# Patient Record
Sex: Female | Born: 1960
Health system: Southern US, Community
[De-identification: ages and names within clinical notes are randomized; demographics above are authoritative.]

## PROBLEM LIST (undated history)

## (undated) DIAGNOSIS — N879 Dysplasia of cervix uteri, unspecified: Secondary | ICD-10-CM

## (undated) DIAGNOSIS — E079 Disorder of thyroid, unspecified: Secondary | ICD-10-CM

## (undated) DIAGNOSIS — B019 Varicella without complication: Secondary | ICD-10-CM

## (undated) DIAGNOSIS — R011 Cardiac murmur, unspecified: Secondary | ICD-10-CM

## (undated) DIAGNOSIS — C4491 Basal cell carcinoma of skin, unspecified: Secondary | ICD-10-CM

## (undated) DIAGNOSIS — R87619 Unspecified abnormal cytological findings in specimens from cervix uteri: Secondary | ICD-10-CM

## (undated) HISTORY — DX: Disorder of thyroid, unspecified: E07.9

## (undated) HISTORY — DX: Basal cell carcinoma of skin, unspecified: C44.91

## (undated) HISTORY — DX: Cardiac murmur, unspecified: R01.1

## (undated) HISTORY — DX: Dysplasia of cervix uteri, unspecified: N87.9

## (undated) HISTORY — PX: MOHS SURGERY: SUR867

## (undated) HISTORY — DX: Unspecified abnormal cytological findings in specimens from cervix uteri: R87.619

## (undated) HISTORY — PX: AUGMENTATION MAMMAPLASTY: SUR837

## (undated) HISTORY — DX: Varicella without complication: B01.9

---

## 2002-06-07 HISTORY — PX: OTHER SURGICAL HISTORY: SHX169

## 2002-06-07 HISTORY — PX: BREAST SURGERY: SHX581

## 2003-03-23 LAB — HM MAMMOGRAPHY: HM Mammogram: NEGATIVE

## 2005-06-07 DIAGNOSIS — N879 Dysplasia of cervix uteri, unspecified: Secondary | ICD-10-CM

## 2005-06-07 HISTORY — DX: Dysplasia of cervix uteri, unspecified: N87.9

## 2005-06-07 HISTORY — PX: GYNECOLOGIC CRYOSURGERY: SHX857

## 2010-03-22 LAB — HM PAP SMEAR: HM Pap smear: NORMAL

## 2011-01-21 ENCOUNTER — Ambulatory Visit (INDEPENDENT_AMBULATORY_CARE_PROVIDER_SITE_OTHER): Payer: Managed Care, Other (non HMO) | Admitting: Family Medicine

## 2011-01-21 ENCOUNTER — Encounter: Payer: Self-pay | Admitting: Family Medicine

## 2011-01-21 VITALS — BP 128/80 | HR 72 | Temp 98.1°F | Resp 12 | Ht 64.5 in | Wt 170.0 lb

## 2011-01-21 DIAGNOSIS — F411 Generalized anxiety disorder: Secondary | ICD-10-CM

## 2011-01-21 DIAGNOSIS — G4700 Insomnia, unspecified: Secondary | ICD-10-CM

## 2011-01-21 DIAGNOSIS — Z0000 Encounter for general adult medical examination without abnormal findings: Secondary | ICD-10-CM

## 2011-01-21 DIAGNOSIS — F419 Anxiety disorder, unspecified: Secondary | ICD-10-CM

## 2011-01-21 LAB — LIPID PANEL
Cholesterol: 240 mg/dL — ABNORMAL HIGH (ref 0–200)
Total CHOL/HDL Ratio: 3
Triglycerides: 141 mg/dL (ref 0.0–149.0)

## 2011-01-21 LAB — CBC WITH DIFFERENTIAL/PLATELET
Basophils Absolute: 0 10*3/uL (ref 0.0–0.1)
Eosinophils Absolute: 0.1 10*3/uL (ref 0.0–0.7)
HCT: 41.9 % (ref 36.0–46.0)
Hemoglobin: 14.4 g/dL (ref 12.0–15.0)
Lymphs Abs: 1.8 10*3/uL (ref 0.7–4.0)
MCHC: 34.3 g/dL (ref 30.0–36.0)
Monocytes Absolute: 0.4 10*3/uL (ref 0.1–1.0)
Neutro Abs: 3 10*3/uL (ref 1.4–7.7)
Platelets: 173 10*3/uL (ref 150.0–400.0)
RDW: 12 % (ref 11.5–14.6)

## 2011-01-21 LAB — HEPATIC FUNCTION PANEL
Albumin: 4.3 g/dL (ref 3.5–5.2)
Total Protein: 7.5 g/dL (ref 6.0–8.3)

## 2011-01-21 LAB — LDL CHOLESTEROL, DIRECT: Direct LDL: 158 mg/dL

## 2011-01-21 LAB — BASIC METABOLIC PANEL
BUN: 14 mg/dL (ref 6–23)
Chloride: 102 mEq/L (ref 96–112)
Creatinine, Ser: 0.9 mg/dL (ref 0.4–1.2)
GFR: 69.31 mL/min (ref >60.00)
Potassium: 4.3 mEq/L (ref 3.5–5.1)

## 2011-01-21 LAB — TSH: TSH: 3.95 u[IU]/mL (ref 0.35–5.50)

## 2011-01-21 MED ORDER — LORAZEPAM 0.5 MG PO TABS: 0.5000 mg | ORAL_TABLET | Freq: Three times a day (TID) | ORAL | Status: AC

## 2011-01-21 MED ORDER — ZOLPIDEM TARTRATE 10 MG PO TABS: 10.0000 mg | ORAL_TABLET | Freq: Every evening | ORAL | Status: DC | PRN

## 2011-01-21 NOTE — Progress Notes (Signed)
  Subjective:    Patient ID: Colleen Martinez, female    DOB: 08-01-1960, 50 y.o.   MRN: [id number]  HPI New patient to establish care. 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 Breast augmentation surgery 2004. Sees gynecologist regularly. Maintain on birth control. No known drug allergies. No chronic medical problems.  Increased anxiety mostly related to work. Previously has been on low-dose benzodiazepine which she uses very rarely. Requesting refills. Also has transient problems with sleep and has taken generic Ambien in the past rarely and requesting refill.  Family history is significant for mother with ovarian cancer. Both parents with hyperlipidemia. Both parents osteoarthritis.  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  Nonsmoker. Occasional alcohol use.  Last mammogram 2004. Tetanus 2007.  Patient's concerns of recent weight gain. 35 pound weight gain in past 3-4 years. No consistent exercise but started back recently. Trying to make some dietary changes as well.   Review of Systems  Constitutional: Negative for fever, activity change, appetite change and fatigue.  HENT: Negative for hearing loss, ear pain, sore throat and trouble swallowing.   Eyes: Negative for visual disturbance.  Respiratory: Negative for cough and shortness of breath.   Cardiovascular: Negative for chest pain and palpitations.  Gastrointestinal: Negative for abdominal pain, diarrhea, constipation and blood in stool.  Genitourinary: Negative for dysuria and hematuria.  Musculoskeletal: Negative for myalgias, back pain and arthralgias.  Skin: Negative for rash.  Neurological: Negative for dizziness, syncope and headaches.  Hematological: Negative for adenopathy.  Psychiatric/Behavioral: Negative for confusion and dysphoric mood.       Objective:   Physical Exam  Constitutional: She is oriented to person, place, and time. She appears well-developed and  well-nourished.  HENT:  Head: Normocephalic and atraumatic.  Eyes: EOM are normal. Pupils are equal, round, and reactive to light.  Neck: Normal range of motion. Neck supple. No thyromegaly present.  Cardiovascular: Normal rate, regular rhythm and normal heart sounds.   No murmur heard. Pulmonary/Chest: Breath sounds normal. No respiratory distress. She has no wheezes. She has no rales.  Abdominal: Soft. Bowel sounds are normal. She exhibits no distension and no mass. There is no tenderness. There is no rebound and no guarding.  Genitourinary:       Breast and pelvic per GYN  Musculoskeletal: Normal range of motion. She exhibits no edema.  Lymphadenopathy:    She has no cervical adenopathy.  Neurological: She is alert and oriented to person, place, and time. She displays normal reflexes. No cranial nerve deficit.  Skin: No rash noted.  Psychiatric: She has a normal mood and affect. Her behavior is normal. Judgment and thought content normal.          Assessment & Plan:  Health maintenance. Obtain screening lab work. Discussed diet and weight loss strategies. Patient will continue to see GYN for Pap smears and breast exam. Tetanus up-to-date.  History of chronic anxiety and transient insomnia. Refill lorazepam 0.5 mg every 6 hours to use sparingly and generic Ambien to use prn for sleep issues.  Sleep hygiene discussed.

## 2011-01-22 NOTE — Progress Notes (Signed)
Quick Note:  Pt informed on home VM ______ 

## 2011-03-23 ENCOUNTER — Encounter: Payer: Self-pay | Admitting: Gynecology

## 2011-03-23 ENCOUNTER — Ambulatory Visit (INDEPENDENT_AMBULATORY_CARE_PROVIDER_SITE_OTHER): Payer: Managed Care, Other (non HMO) | Admitting: Gynecology

## 2011-03-23 ENCOUNTER — Other Ambulatory Visit (HOSPITAL_COMMUNITY)
Admission: RE | Admit: 2011-03-23 | Discharge: 2011-03-23 | Disposition: A | Discharge status: Home / Self Care | Payer: Managed Care, Other (non HMO) | Source: Ambulatory Visit | Attending: Gynecology | Admitting: Gynecology

## 2011-03-23 VITALS — BP 120/70 | Ht 65.0 in | Wt 174.0 lb

## 2011-03-23 DIAGNOSIS — Z3009 Encounter for other general counseling and advice on contraception: Secondary | ICD-10-CM

## 2011-03-23 DIAGNOSIS — B3731 Acute candidiasis of vulva and vagina: Secondary | ICD-10-CM

## 2011-03-23 DIAGNOSIS — N898 Other specified noninflammatory disorders of vagina: Secondary | ICD-10-CM

## 2011-03-23 DIAGNOSIS — B373 Candidiasis of vulva and vagina: Secondary | ICD-10-CM

## 2011-03-23 DIAGNOSIS — Z01419 Encounter for gynecological examination (general) (routine) without abnormal findings: Secondary | ICD-10-CM

## 2011-03-23 DIAGNOSIS — R823 Hemoglobinuria: Secondary | ICD-10-CM

## 2011-03-23 DIAGNOSIS — Z0000 Encounter for general adult medical examination without abnormal findings: Secondary | ICD-10-CM

## 2011-03-23 MED ORDER — NORETHIN ACE-ETH ESTRAD-FE 1.5-30 MG-MCG PO TABS: 1.0000 | ORAL_TABLET | Freq: Every day | ORAL | Status: DC

## 2011-03-23 MED ORDER — FLUCONAZOLE 150 MG PO TABS: 150.0000 mg | ORAL_TABLET | Freq: Once | ORAL | Status: AC

## 2011-03-23 NOTE — Progress Notes (Signed)
Colleen Martinez 01-24-1961 [id number]        50 y.o.  for annual exam.  Former patient of Dr. Leota Sauers doing well. She does note some difficulty losing weight although admits to not exercising. She recently saw Dr. Caryl Never her primary who did routine blood work to include a thyroid panel which was normal. She is on oral contraceptives and doing well with these. She does have a history of low-grade SIL Pap smear in 2007 with subsequent cryosurgery and has had normal Pap smears since then.  Past medical history,surgical history, medications, allergies, family history and social history were all reviewed and documented in the EPIC chart. ROS:  Was performed and pertinent positives and negatives are included in the history.  Exam: chaperone present Filed Vitals:   03/23/11 0850  BP: 120/70   General appearance  Normal Skin grossly normal Head/Neck normal with no cervical or supraclavicular adenopathy thyroid normal Lungs  clear Cardiac RR, 1/6 systolic ejection murmur Abdominal  soft, nontender, without masses, organomegaly or hernia Breasts  examined lying and sitting without masses, retractions, discharge or axillary adenopathy.  Bilateral implants noted. Pelvic  Ext/BUS/vagina  normal with white discharge KOH wet prep done  Cervix  normal  Pap done  Uterus  anteverted, normal size, shape and contour, midline and mobile nontender   Adnexa  Without masses or tenderness    Anus and perineum  normal   Rectovaginal  normal sphincter tone without palpated masses or tenderness.    Assessment/Plan:  50 y.o. female for annual exam.    1. White discharge. KOH wet prep is positive for yeast we'll treat with Diflucan 150x1 dose follow up if symptoms develop. 2. Low-grade SIL Pap smear 2007 status post cryosurgery. Pap smears since then have been normal. I discussed current screening recommendations for less frequent screening. I did a Pap smear today if normal I discussed going to an every  3 year screen and she agrees with this. 3. Oral contraceptives. I reviewed the risk of oral contraceptives to include increased risk of stroke heart attack DVT. She is not being followed for medical issues does not smoke accepts the risks and wants to continue it I refilled her x1 year. 4. Heart murmur. Patient has had this evaluated in the past and was felt to be a functional murmur no further evaluation needed 5. Health maintenance. Self breast exams on a monthly basis discussed encouraged. She does have bilateral implants.  She's not had a mammogram in several years I recommended she go ahead and schedule this and she agrees to do so. No blood work was done today this is all done through Dr. Lucie Leather office.  Assuming she continues well then she will see [place] in a year sooner as needed.    Dara Lords MD, 9:58 AM 03/23/2011

## 2011-03-25 ENCOUNTER — Encounter: Payer: Self-pay | Admitting: Gynecology

## 2011-08-02 ENCOUNTER — Telehealth: Payer: Self-pay | Admitting: *Deleted

## 2011-08-02 MED ORDER — FLUCONAZOLE 150 MG PO TABS: 150.0000 mg | ORAL_TABLET | Freq: Once | ORAL | Status: AC

## 2011-08-02 NOTE — Telephone Encounter (Signed)
Pt called c/o yeast infection x 2 days now. Itching, very light discharge, and vaginal irritation. Pt called requesting diflucan which was given at annual on 03/23/11. Please advise

## 2011-08-02 NOTE — Telephone Encounter (Signed)
Pt informed with the below note. 

## 2011-08-02 NOTE — Telephone Encounter (Signed)
Diflucan 150 mg x1 and okay.

## 2011-08-23 ENCOUNTER — Ambulatory Visit (INDEPENDENT_AMBULATORY_CARE_PROVIDER_SITE_OTHER): Payer: Managed Care, Other (non HMO) | Admitting: Family Medicine

## 2011-08-23 ENCOUNTER — Encounter: Payer: Self-pay | Admitting: Family Medicine

## 2011-08-23 VITALS — BP 142/80 | Temp 98.1°F | Wt 178.0 lb

## 2011-08-23 DIAGNOSIS — J209 Acute bronchitis, unspecified: Secondary | ICD-10-CM

## 2011-08-23 MED ORDER — HYDROCOD POLST-CHLORPHEN POLST 10-8 MG/5ML PO LQCR: 5.0000 mL | Freq: Two times a day (BID) | ORAL | Status: DC | PRN

## 2011-08-23 NOTE — Patient Instructions (Signed)

## 2011-08-23 NOTE — Progress Notes (Signed)
  Subjective:    Patient ID: Colleen Martinez, female    DOB: 1960-08-17, 51 y.o.   MRN: [id number]  HPI  Acute visit.  3-4 day history of cough which is mostly nonproductive. Initially started with sore throat which is slightly better. She has postnasal drainage and some nausea related to that. No vomiting. Poor sleep secondary to cough. Increased fatigue. No fever. Several coworkers have had somewhat similar illness. Patient is nonsmoker   Review of Systems  Constitutional: Positive for fatigue. Negative for fever and chills.  HENT: Positive for congestion and postnasal drip. Negative for ear pain, sore throat and voice change.   Respiratory: Positive for cough. Negative for shortness of breath and wheezing.   Cardiovascular: Negative for chest pain.  Neurological: Negative for headaches.       Objective:   Physical Exam  Constitutional: She appears well-developed and well-nourished. No distress.  HENT:  Right Ear: External ear normal.  Left Ear: External ear normal.  Mouth/Throat: Oropharynx is clear and moist.  Neck: Neck supple. No thyromegaly present.  Cardiovascular: Normal rate and regular rhythm.   Pulmonary/Chest: Effort normal and breath sounds normal. No respiratory distress. She has no wheezes. She has no rales.  Lymphadenopathy:    She has no cervical adenopathy.          Assessment & Plan:  Cough. Suspect acute viral bronchitis. Tussionex for nighttime cough as needed. No indication for antibiotic at this time

## 2011-09-06 ENCOUNTER — Telehealth: Payer: Self-pay | Admitting: *Deleted

## 2011-09-06 NOTE — Telephone Encounter (Signed)
Be happy to see today,,,,,,,,,, cannot: Antibiotics over the telephone

## 2011-09-06 NOTE — Telephone Encounter (Signed)
Pt informed on personally identified VM 

## 2011-09-06 NOTE — Telephone Encounter (Signed)
Dr Caryl Never out of the office until Wed.  Please advise

## 2011-09-06 NOTE — Telephone Encounter (Signed)
Pt states she is not better with her cough and sinus congestion, and Dr. Caryl Never advised her to call him back if not, and he will call in a Zpack.

## 2011-09-23 ENCOUNTER — Telehealth: Payer: Self-pay | Admitting: *Deleted

## 2011-09-23 NOTE — Telephone Encounter (Signed)
Start with plain Allegra at 180 mg once daily

## 2011-09-23 NOTE — Telephone Encounter (Signed)
Pt has gotten over the bronchitis, but now has swollen sinus passages from seasonal allergies,and would like to know what Dr. Caryl Never would recommend OTC to take for this one symptom?

## 2011-09-23 NOTE — Telephone Encounter (Signed)
Pt. Notified.

## 2012-01-05 ENCOUNTER — Ambulatory Visit (INDEPENDENT_AMBULATORY_CARE_PROVIDER_SITE_OTHER): Payer: Managed Care, Other (non HMO) | Admitting: Family Medicine

## 2012-01-05 ENCOUNTER — Encounter: Payer: Self-pay | Admitting: Family Medicine

## 2012-01-05 VITALS — BP 138/86 | Temp 98.0°F | Wt 176.0 lb

## 2012-01-05 DIAGNOSIS — R5383 Other fatigue: Secondary | ICD-10-CM

## 2012-01-05 DIAGNOSIS — E785 Hyperlipidemia, unspecified: Secondary | ICD-10-CM

## 2012-01-05 DIAGNOSIS — M545 Low back pain, unspecified: Secondary | ICD-10-CM

## 2012-01-05 DIAGNOSIS — R5381 Other malaise: Secondary | ICD-10-CM

## 2012-01-05 DIAGNOSIS — IMO0001 Reserved for inherently not codable concepts without codable children: Secondary | ICD-10-CM

## 2012-01-05 DIAGNOSIS — R030 Elevated blood-pressure reading, without diagnosis of hypertension: Secondary | ICD-10-CM

## 2012-01-05 LAB — BASIC METABOLIC PANEL
CO2: 24 mEq/L (ref 19–32)
Calcium: 9 mg/dL (ref 8.4–10.5)
Chloride: 104 mEq/L (ref 96–112)
Glucose, Bld: 90 mg/dL (ref 70–99)
Sodium: 138 mEq/L (ref 135–145)

## 2012-01-05 LAB — CBC WITH DIFFERENTIAL/PLATELET
Basophils Absolute: 0 10*3/uL (ref 0.0–0.1)
Eosinophils Relative: 0.9 % (ref 0.0–5.0)
HCT: 40 % (ref 36.0–46.0)
Hemoglobin: 13.7 g/dL (ref 12.0–15.0)
Lymphocytes Relative: 28.3 % (ref 12.0–46.0)
Lymphs Abs: 1.6 10*3/uL (ref 0.7–4.0)
Monocytes Relative: 6.1 % (ref 3.0–12.0)
Neutro Abs: 3.6 10*3/uL (ref 1.4–7.7)
Platelets: 176 10*3/uL (ref 150.0–400.0)
RDW: 12.1 % (ref 11.5–14.6)
WBC: 5.6 10*3/uL (ref 4.5–10.5)

## 2012-01-05 LAB — LIPID PANEL: Total CHOL/HDL Ratio: 4

## 2012-01-05 LAB — TSH: TSH: 3.57 u[IU]/mL (ref 0.35–5.50)

## 2012-01-05 MED ORDER — CLORAZEPATE DIPOTASSIUM 3.75 MG PO TABS: 3.7500 mg | ORAL_TABLET | Freq: Two times a day (BID) | ORAL | Status: AC | PRN

## 2012-01-05 NOTE — Progress Notes (Signed)
  Subjective:    Patient ID: Colleen Martinez, female    DOB: [birth year], 51 y.o.   MRN: [id number]  HPI  Patient seen with multiple issues today. She has progressive fatigue over several weeks. Generally sleeping well. Increased job stress. She states she's had some mild weight gain recently but looking over records she's actually lost 2 pounds since last visit here several months ago. She has seen a nutritionist and trainer and does exercise fairly regularly but still feels fatigue and has not been able to lose weight. Check thyroid functions about a year ago which were normal. She's had slightly increasing blood pressure recently with systolic 140-150 . No headaches.  Low back pain for one and one half years off and on. Sits mostly at work. Location is mid lumbar back. Occasional radiation toward thighs bilaterally. She's tried nonsteroidals, hydrocodone, muscle relaxers, and gabapentin without relief. Was placed on prednisone once which helped slightly and temporarily. No numbness or weakness. She's never tried physical therapy. No history of x-rays. No prior history of cancer. No appetite or weight changes. Pain is worse supine.  Past Medical History  Diagnosis Date  . Chicken pox   . Heart murmur     functional   Past Surgical History  Procedure Date  . Breast surgery 2004    breast augmentation  . Liposuction 2004    stomach , bi-lat thighs  . Gynecologic cryosurgery 2007    LGSIL normal paps since    reports that she has never smoked. She has never used smokeless tobacco. She reports that she drinks alcohol. She reports that she does not use illicit drugs. family history includes Arthritis in her father and mother; Cancer in her mother; and Hyperlipidemia in her father and mother. No Known Allergies    Review of Systems  Constitutional: Positive for fatigue. Negative for fever, chills, appetite change and unexpected weight change.  Respiratory: Negative for cough and shortness  of breath.   Cardiovascular: Negative for chest pain and leg swelling.  Gastrointestinal: Negative for abdominal pain.  Genitourinary: Negative for dysuria.  Neurological: Negative for dizziness, weakness and headaches.  Hematological: Negative for adenopathy.       Objective:   Physical Exam  Constitutional: She is oriented to person, place, and time. She appears well-developed and well-nourished. No distress.  HENT:  Mouth/Throat: Oropharynx is clear and moist.  Neck: Neck supple. No thyromegaly present.  Cardiovascular: Normal rate and regular rhythm.   Pulmonary/Chest: Effort normal and breath sounds normal. No respiratory distress. She has no wheezes. She has no rales.  Musculoskeletal: She exhibits no edema.  Lymphadenopathy:    She has no cervical adenopathy.  Neurological: She is alert and oriented to person, place, and time. No cranial nerve deficit.          Assessment & Plan:  #1 borderline elevated blood pressure. Work on weight loss and continue aerobic exercise and continue close monitoring #2 increasing fatigue. Uncertain etiology. Check labs including TSH  #3 history of hyperlipidemia. Recheck fasting lipid panel  #4 chronic intermittent low back pain. Nonfocal neurologic exam. Suspect musculoskeletal. Trial physical therapy. If better in 3-4 weeks consider MRI to further assess

## 2012-01-07 NOTE — Progress Notes (Signed)
Quick Note:  Pt informed ______ 

## 2012-03-06 ENCOUNTER — Other Ambulatory Visit: Payer: Self-pay | Admitting: Gynecology

## 2012-03-28 ENCOUNTER — Encounter: Payer: Self-pay | Admitting: Gynecology

## 2012-03-28 ENCOUNTER — Ambulatory Visit (INDEPENDENT_AMBULATORY_CARE_PROVIDER_SITE_OTHER): Payer: Managed Care, Other (non HMO) | Admitting: Gynecology

## 2012-03-28 VITALS — BP 126/86 | Ht 64.75 in | Wt 176.0 lb

## 2012-03-28 DIAGNOSIS — Z309 Encounter for contraceptive management, unspecified: Secondary | ICD-10-CM

## 2012-03-28 DIAGNOSIS — Z01419 Encounter for gynecological examination (general) (routine) without abnormal findings: Secondary | ICD-10-CM

## 2012-03-28 MED ORDER — ALPRAZOLAM 0.25 MG PO TABS: 0.2500 mg | ORAL_TABLET | Freq: Every evening | ORAL | Status: DC | PRN

## 2012-03-28 MED ORDER — NORETHIN ACE-ETH ESTRAD-FE 1.5-30 MG-MCG PO TABS: 1.0000 | ORAL_TABLET | Freq: Every day | ORAL | Status: DC

## 2012-03-28 NOTE — Progress Notes (Signed)
Colleen Martinez [birth year] [id number]        51 y.o.  G3P0030 for annual exam.  Doing well.  Past medical history,surgical history, medications, allergies, family history and social history were all reviewed and documented in the EPIC chart. ROS:  Was performed and pertinent positives and negatives are included in the history.  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  Bilateral implants noted Pelvic  Ext/BUS/vagina  normal   Cervix  normal   Uterus  anteverted, normal size, shape and contour, midline and mobile nontender   Adnexa  Without masses or tenderness    Anus and perineum  normal   Rectovaginal  normal sphincter tone without palpated masses or tenderness.    Assessment/Plan:  51 y.o. G33P0030 female for annual exam.   1. Oral contraceptives. Patient doing well with scant to absent menses. No menopausal symptoms during pill free week. Reviewed risks again with her to include stroke heart attack DVT. Options to continue, switch to a different methods such as IUD or to a lower dose 20 mcg pill all reviewed. Patient was to continue she's doing well and I refilled her times a year.  Will plan pill free week FSH as she gets into the 50s. 2. Anxiety. Patient having some anxiety with mother while living with her. Various options reviewed. Patient wants to try Xanax 0.25 #30 refill x1 when necessary. Side effect profile reviewed. 3. Mammography. Patient has not had a mammogram in a number of years. I strongly recommended she schedule this and the issues of early  detection reviewed. Patient agrees to schedule. SBE monthly reviewed. 4. Pap smear. No Pap smear done today. Last Pap smear 2012. Plan every 3-5 your screening. 5. Heart murmur. 1/6 systolic ejection murmur consistent with functional murmur issues been diagnosed with in the past. 6. Health maintenance. Actively sees Dr. Caryl Never with recent blood work. The blood work done today. Follow up one year, sooner as needed.    Dara Lords MD, 9:26 AM 03/28/2012

## 2012-03-28 NOTE — Patient Instructions (Signed)
Try Xanax as we discussed. Let me know if there are any issues. Follow up in one year for annual gynecologic exam.

## 2012-07-14 ENCOUNTER — Other Ambulatory Visit: Payer: Self-pay | Admitting: *Deleted

## 2012-07-14 DIAGNOSIS — G4700 Insomnia, unspecified: Secondary | ICD-10-CM

## 2012-07-14 MED ORDER — ZOLPIDEM TARTRATE 10 MG PO TABS: 10.0000 mg | ORAL_TABLET | Freq: Every evening | ORAL | Status: DC | PRN

## 2012-07-24 ENCOUNTER — Encounter: Payer: Self-pay | Admitting: Family Medicine

## 2012-07-24 ENCOUNTER — Ambulatory Visit (INDEPENDENT_AMBULATORY_CARE_PROVIDER_SITE_OTHER): Payer: BC Managed Care – PPO | Admitting: Family Medicine

## 2012-07-24 VITALS — BP 140/84 | Temp 98.8°F | Wt 179.0 lb

## 2012-07-24 DIAGNOSIS — J209 Acute bronchitis, unspecified: Secondary | ICD-10-CM

## 2012-07-24 DIAGNOSIS — J208 Acute bronchitis due to other specified organisms: Secondary | ICD-10-CM

## 2012-07-24 MED ORDER — HYDROCOD POLST-CHLORPHEN POLST 10-8 MG/5ML PO LQCR: 5.0000 mL | Freq: Two times a day (BID) | ORAL | Status: DC | PRN

## 2012-07-24 NOTE — Patient Instructions (Addendum)

## 2012-07-24 NOTE — Progress Notes (Signed)
  Subjective:    Patient ID: Colleen Martinez, female    DOB: August 14, 1960, 52 y.o.   MRN: [id number]  HPI Acute visit. Cough was started about one week ago. She's had some mild nasal congestion. No fevers or chills. Several coworkers with similar symptoms. Using NyQuil at night. Requesting refill Tussionex which is work well past. Nonsmoker. No dyspnea. No significant sore throat. No nausea or vomiting.  Past Medical History  Diagnosis Date  . Chicken pox   . Heart murmur     functional   Past Surgical History  Procedure Laterality Date  . Breast surgery  2004    breast augmentation  . Liposuction  2004    stomach , bi-lat thighs  . Gynecologic cryosurgery  2007    LGSIL normal paps since    reports that she has never smoked. She has never used smokeless tobacco. She reports that  drinks alcohol. She reports that she does not use illicit drugs. family history includes Arthritis in her father and mother; Cancer in her mother; and Hyperlipidemia in her father and mother. No Known Allergies     Review of Systems  Constitutional: Positive for fatigue. Negative for fever and chills.  HENT: Positive for congestion. Negative for sore throat.   Respiratory: Positive for cough. Negative for wheezing.   Neurological: Negative for headaches.       Objective:   Physical Exam  Constitutional: She appears well-developed and well-nourished. No distress.  HENT:  Right Ear: External ear normal.  Left Ear: External ear normal.  Mouth/Throat: Oropharynx is clear and moist.  Neck: Neck supple. No thyromegaly present.  Cardiovascular: Normal rate and regular rhythm.   Pulmonary/Chest: Effort normal and breath sounds normal. No respiratory distress. She has no wheezes. She has no rales.  Lymphadenopathy:    She has no cervical adenopathy.          Assessment & Plan:  Acute bronchitis. Suspect viral. Refill Tussionex for nighttime use as needed. Followup promptly for fever or worsening  symptoms

## 2012-07-25 ENCOUNTER — Ambulatory Visit: Payer: Managed Care, Other (non HMO) | Admitting: Family Medicine

## 2013-02-20 ENCOUNTER — Other Ambulatory Visit: Payer: Self-pay

## 2013-02-20 DIAGNOSIS — Z1231 Encounter for screening mammogram for malignant neoplasm of breast: Secondary | ICD-10-CM

## 2013-03-04 ENCOUNTER — Other Ambulatory Visit: Payer: Self-pay | Admitting: Gynecology

## 2013-03-12 ENCOUNTER — Ambulatory Visit
Admission: RE | Admit: 2013-03-12 | Discharge: 2013-03-12 | Disposition: A | Discharge status: Home / Self Care | Payer: BC Managed Care – PPO | Source: Ambulatory Visit

## 2013-03-12 DIAGNOSIS — Z1231 Encounter for screening mammogram for malignant neoplasm of breast: Secondary | ICD-10-CM

## 2013-03-29 ENCOUNTER — Encounter: Payer: Self-pay | Admitting: Gynecology

## 2013-03-29 ENCOUNTER — Ambulatory Visit (INDEPENDENT_AMBULATORY_CARE_PROVIDER_SITE_OTHER): Payer: BC Managed Care – PPO | Admitting: Gynecology

## 2013-03-29 VITALS — BP 124/78 | Ht 65.0 in | Wt 174.0 lb

## 2013-03-29 DIAGNOSIS — R635 Abnormal weight gain: Secondary | ICD-10-CM

## 2013-03-29 DIAGNOSIS — Z309 Encounter for contraceptive management, unspecified: Secondary | ICD-10-CM

## 2013-03-29 DIAGNOSIS — Z01419 Encounter for gynecological examination (general) (routine) without abnormal findings: Secondary | ICD-10-CM

## 2013-03-29 MED ORDER — NORETHIN ACE-ETH ESTRAD-FE 1.5-30 MG-MCG PO TABS: ORAL_TABLET | ORAL | Status: DC

## 2013-03-29 NOTE — Progress Notes (Signed)
Colleen Martinez [birth year] [id number]        52 y.o.  G3P0030 for annual exam.  Doing well.  Past medical history,surgical history, medications, allergies, family history and social history were all reviewed and documented in the EPIC chart.  ROS:  Performed and pertinent positives and negatives are included in the history, assessment and plan .  Exam: Kim assistant Filed Vitals:   03/29/13 0845  BP: 124/78  Height: 5\' 5"  (1.651 m)  Weight: 174 lb (78.926 kg)   General appearance  Normal Skin grossly normal Head/Neck normal with no cervical or supraclavicular adenopathy thyroid normal Lungs  clear Cardiac RR, without RMG. Murmur not appreciated today Abdominal  soft, nontender, without masses, organomegaly or hernia Breasts  examined lying and sitting without masses, retractions, discharge or axillary adenopathy. Bilateral implants noted Pelvic  Ext/BUS/vagina  normal  Cervix  normal  Uterus  anteverted, normal size, shape and contour, midline and mobile nontender   Adnexa  Without masses or tenderness    Anus and perineum  normal   Rectovaginal  normal sphincter tone without palpated masses or tenderness.    Assessment/Plan:  52 y.o. G98P0030 female for annual exam, regular light menses, oral contraceptives.   1. Contraceptive management. Patient on oral contraceptives doing well. Alternatives to include progesterone only such as Depo-Provera/nexplanon as well as IUDs/sterilization reviewed. Increased risk of stroke, heart attack, DVT associated with oral contraceptives. Possible increased risk with advancing age. Patient understands, accepts and wants to continue it. I refilled her x1 year. I did suggest checking a pill free week FSH and she'll come back for that. 2. Weight control. Patient noted some difficulty losing weight although admits to dietary indiscretion and lack of exercise. Emphasized the need for regular exercise and dietary discretion. Will check TSH when she gets  her [facility] for completeness. 3. Pap smear 2012. No Pap smear done today. History of LGSIL 2007 with cryosurgery and normal Pap smears since then. Plan repeat Pap smear next year 3 year interval. 4. Mammography 03/2013. Continue with annual mammography. Bilateral implants noted. SBE monthly reviewed.  5. History of functional heart murmur. Not appreciated today on exam. 6. Health maintenance. No other blood work done as Dr. Caryl Never does all of this for her. Followup for her TSH/FSH, otherwise annually.  Note: This document was prepared with digital dictation and possible smart phrase technology. Any transcriptional errors that result from this process are unintentional.   Dara Lords MD, 9:18 AM 03/29/2013

## 2013-03-29 NOTE — Patient Instructions (Signed)
Followup for your blood work the end of your pill free week on your birth control pills. Otherwise followup in 1 year for annual exam.

## 2013-04-06 ENCOUNTER — Other Ambulatory Visit: Payer: BC Managed Care – PPO

## 2013-04-06 ENCOUNTER — Encounter: Payer: Self-pay | Admitting: Gynecology

## 2013-04-06 DIAGNOSIS — R635 Abnormal weight gain: Secondary | ICD-10-CM

## 2013-04-07 LAB — TSH: TSH: 4.665 u[IU]/mL — ABNORMAL HIGH (ref 0.350–4.500)

## 2013-04-09 ENCOUNTER — Other Ambulatory Visit: Payer: Self-pay | Admitting: Gynecology

## 2013-04-09 DIAGNOSIS — R7989 Other specified abnormal findings of blood chemistry: Secondary | ICD-10-CM

## 2013-04-16 ENCOUNTER — Other Ambulatory Visit: Payer: BC Managed Care – PPO

## 2013-04-16 DIAGNOSIS — R7989 Other specified abnormal findings of blood chemistry: Secondary | ICD-10-CM

## 2013-04-16 LAB — THYROID PANEL WITH TSH
Free Thyroxine Index: 3 (ref 1.0–3.9)
T4, Total: 10.7 ug/dL (ref 5.0–12.5)
TSH: 4.662 u[IU]/mL — ABNORMAL HIGH (ref 0.350–4.500)

## 2013-04-18 ENCOUNTER — Telehealth: Payer: Self-pay | Admitting: *Deleted

## 2013-04-18 NOTE — Telephone Encounter (Signed)
Appt. 05/22/13 @ 1:30 pm pt informed.

## 2013-04-18 NOTE — Telephone Encounter (Signed)
Message copied by Aura Camps on Wed Apr 18, 2013  9:20 AM ------      Message from: Keenan Bachelor      Created: Tue Apr 17, 2013 12:31 PM      Regarding: Dr. Talmage Nap referral       Tell patient that her TSH still remains mildly elevated. Could indicate the beginning of hypothyroidism. Would recommend evaluation and treatment recommendations by endocrinologist. Arrange appointment with Dr Willeen Cass office                  Patient informed. Knows she will hear from you about appt.             Thanks!! ------

## 2013-04-18 NOTE — Telephone Encounter (Signed)
Notes faxed to [facility] office , they will contact pt with time and date.

## 2013-05-11 ENCOUNTER — Telehealth: Payer: Self-pay | Admitting: Family Medicine

## 2013-05-11 NOTE — Telephone Encounter (Signed)
Opened in error/kh 

## 2013-05-17 ENCOUNTER — Ambulatory Visit (INDEPENDENT_AMBULATORY_CARE_PROVIDER_SITE_OTHER): Payer: BC Managed Care – PPO | Admitting: Family Medicine

## 2013-05-17 ENCOUNTER — Encounter: Payer: Self-pay | Admitting: Family Medicine

## 2013-05-17 VITALS — BP 150/98 | HR 82 | Temp 98.1°F | Wt 176.0 lb

## 2013-05-17 DIAGNOSIS — J329 Chronic sinusitis, unspecified: Secondary | ICD-10-CM

## 2013-05-17 MED ORDER — AMOXICILLIN 875 MG PO TABS: 875.0000 mg | ORAL_TABLET | Freq: Two times a day (BID) | ORAL | Status: DC

## 2013-05-17 MED ORDER — HYDROCOD POLST-CHLORPHEN POLST 10-8 MG/5ML PO LQCR: 5.0000 mL | Freq: Two times a day (BID) | ORAL | Status: DC | PRN

## 2013-05-17 NOTE — Patient Instructions (Signed)

## 2013-05-17 NOTE — Progress Notes (Signed)
No chief complaint on file.   HPI:  Colleen Martinez is a 52 yo patient of Dr. Caryl Never, here for an acute visit for:  Cough and sinus congestion: -started: 2 weeks ago, got better then got worse again -symptoms:nasal congestion, sore throat, cough, occ sinus pain  -denies:fever, SOB, NVD -has tried: allegra and nasal saline -sick contacts/travel/risks: denies flu exposure, tick exposure or or Ebola risks -Hx NU:[id number] ROS: See pertinent positives and negatives per HPI.  Past Medical History  Diagnosis Date  . Chicken pox   . Heart murmur     functional  . 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 Breast surgery  2004    breast augmentation  . Liposuction  2004    stomach , bi-lat thighs  . Gynecologic cryosurgery  2007    LGSIL normal paps since  . Colposcopy      Family History  Problem Relation Age of Onset  . Arthritis Mother   . Hyperlipidemia Mother   . Ovarian cancer Mother   . Arthritis Father   . Hyperlipidemia Father     History   Social History  . Marital Status: Married    Spouse Name: N/A    Number of Children: N/A  . Years of Education: N/A   Social History Main Topics  . Smoking status: Never Smoker   . Smokeless tobacco: Never Used  . Alcohol Use: 0.5 oz/week    1 drink(s) per week     Comment: social  . Drug Use: No  . Sexual Activity: Yes    Partners: Male    Birth Control/ Protection: Pill   Other Topics Concern  . None   Social History Narrative  . None    Current outpatient prescriptions:fish oil-omega-3 fatty acids 1000 MG capsule, Take 2 g by mouth as needed.  , Disp: , Rfl: ;  MULTIPLE VITAMIN PO, Take by mouth.  , Disp: , Rfl: ;  norethindrone-ethinyl estradiol-iron (JUNEL FE 1.5/30) 1.5-30 MG-MCG tablet, TAKE 1 TABLET BY MOUTH DAILY., Disp: 28 tablet, Rfl: 11;  tretinoin (RETIN-A) 0.025 % cream, Apply topically as needed. , Disp: , Rfl:  zolpidem (AMBIEN) 10 MG tablet, Take 10  mg by mouth at bedtime as needed for sleep., Disp: , Rfl: ;  amoxicillin (AMOXIL) 875 MG tablet, Take 1 tablet (875 mg total) by mouth 2 (two) times daily., Disp: 20 tablet, Rfl: 0;  chlorpheniramine-HYDROcodone (TUSSIONEX PENNKINETIC ER) 10-8 MG/5ML LQCR, Take 5 mLs by mouth every 12 (twelve) hours as needed., Disp: 120 mL, Rfl: 0  EXAM:  Filed Vitals:   05/17/13 1353  BP: 150/98  Pulse: 82  Temp: 98.1 F (36.7 C)    Body mass index is 29.29 kg/(m^2).  GENERAL: vitals reviewed and listed above, alert, oriented, appears well hydrated and in no acute distress  HEENT: atraumatic, conjunttiva clear, no obvious abnormalities on inspection of external nose and ears, normal appearance of ear canals and TMs, clear nasal congestion, mild post oropharyngeal erythema with PND, no tonsillar edema or exudate, no sinus TTP  NECK: no obvious masses on inspection  LUNGS: clear to auscultation bilaterally, no wheezes, rales or rhonchi, good air movement  CV: HRRR, no peripheral edema  MS: moves all extremities without noticeable abnormality  PSYCH: pleasant and cooperative, no obvious depression or anxiety  ASSESSMENT AND PLAN:  Discussed the following assessment and plan:  Rhinosinusitis - Plan: chlorpheniramine-HYDROcodone (TUSSIONEX PENNKINETIC ER) 10-8 MG/5ML LQCR, amoxicillin (AMOXIL)  875 MG tablet  -given HPI and exam findings today, a serious infection or illness is unlikely. We discussed potential etiologies, with VURI being most likely but possible bacterial sinusitis given length of symptoms -Discussed treatment side effects, likely course, antibiotic misuse, transmission, and signs of developing a serious illness. -of course, we advised to return or notify a doctor immediately if symptoms worsen or persist or new concerns arise.    Patient Instructions  INSTRUCTIONS FOR UPPER RESPIRATORY INFECTION:  -plenty of rest and fluids  -nasal saline wash 2-3 times daily (use  prepackaged nasal saline or bottled/distilled water if making your own)   -can use sinex or afrin nasal spray for drainage and nasal congestion - but do NOT use longer then 3-4 days  -can use tylenol or ibuprofen as directed for aches and sorethroat  -in the winter time, using a humidifier at night is helpful (please follow cleaning instructions)  -if you are taking a cough medication - use only as directed, may also try a teaspoon of honey to coat the throat and throat lozenges  -for sore throat, salt water gargles can help  -follow up if you have fevers, facial pain, tooth pain, difficulty breathing or are worsening or not getting better in 5-7 days      Colleen Martinez, Colleen R.

## 2013-05-17 NOTE — Progress Notes (Signed)
Pre visit review using our clinic review tool, if applicable. No additional management support is needed unless otherwise documented below in the visit note. 

## 2013-06-13 ENCOUNTER — Ambulatory Visit (INDEPENDENT_AMBULATORY_CARE_PROVIDER_SITE_OTHER): Payer: BC Managed Care – PPO | Admitting: Gynecology

## 2013-06-13 ENCOUNTER — Encounter: Payer: Self-pay | Admitting: Gynecology

## 2013-06-13 DIAGNOSIS — R300 Dysuria: Secondary | ICD-10-CM

## 2013-06-13 DIAGNOSIS — N898 Other specified noninflammatory disorders of vagina: Secondary | ICD-10-CM

## 2013-06-13 DIAGNOSIS — B9689 Other specified bacterial agents as the cause of diseases classified elsewhere: Secondary | ICD-10-CM

## 2013-06-13 DIAGNOSIS — L293 Anogenital pruritus, unspecified: Secondary | ICD-10-CM

## 2013-06-13 DIAGNOSIS — N760 Acute vaginitis: Secondary | ICD-10-CM

## 2013-06-13 DIAGNOSIS — N390 Urinary tract infection, site not specified: Secondary | ICD-10-CM

## 2013-06-13 DIAGNOSIS — A499 Bacterial infection, unspecified: Secondary | ICD-10-CM

## 2013-06-13 LAB — WET PREP FOR TRICH, YEAST, CLUE
Trich, Wet Prep: NONE SEEN
YEAST WET PREP: NONE SEEN

## 2013-06-13 LAB — URINALYSIS W MICROSCOPIC + REFLEX CULTURE
BILIRUBIN URINE: NEGATIVE
Casts: NONE SEEN
Crystals: NONE SEEN
GLUCOSE, UA: NEGATIVE mg/dL
KETONES UR: NEGATIVE mg/dL
Nitrite: POSITIVE — AB
PROTEIN: NEGATIVE mg/dL
Specific Gravity, Urine: 1.015 (ref 1.005–1.030)
UROBILINOGEN UA: 0.2 mg/dL (ref 0.0–1.0)
pH: 5.5 (ref 5.0–8.0)

## 2013-06-13 MED ORDER — PHENAZOPYRIDINE HCL 200 MG PO TABS: 200.0000 mg | ORAL_TABLET | Freq: Three times a day (TID) | ORAL | Status: DC | PRN

## 2013-06-13 MED ORDER — FLUCONAZOLE 150 MG PO TABS: 150.0000 mg | ORAL_TABLET | Freq: Once | ORAL | Status: DC

## 2013-06-13 MED ORDER — METRONIDAZOLE 500 MG PO TABS: 500.0000 mg | ORAL_TABLET | Freq: Two times a day (BID) | ORAL | Status: DC

## 2013-06-13 MED ORDER — CIPROFLOXACIN HCL 250 MG PO TABS: 250.0000 mg | ORAL_TABLET | Freq: Two times a day (BID) | ORAL | Status: DC

## 2013-06-13 NOTE — Patient Instructions (Addendum)
Take both antibiotics twice daily for 7 days.  Take Pyridium 3 times daily as needed for bladder discomfort. Followup if symptoms persist, worsen or recur.

## 2013-06-13 NOTE — Progress Notes (Signed)
Patient presents with approximately one-week history of progressive dysuria and frequency. No low back pain, fever or chills, nausea vomiting, diarrhea or constipation. Also notes some vaginal itching. Was recently treated for a sinus infection with antibiotics. No odor or significant discharge.  Exam was [employer] Spine straight without CVA tenderness. Abdomen soft nontender without masses guarding rebound or organomegaly. Pelvic external BUS vagina with thick white discharge. Cervix normal. Uterus normal size midline mobile nontender. Adnexa without masses or tenderness.  Assessment and plan: 1. UTI. Symptoms and urinalysis consistent with UTI. Treat with ciprofloxacin 250 mg twice a day x7 days. Followup if symptoms persist, worsen or recur. Peridium 200 mg 3 times a day #10 for her dysuria. 2. Vaginal itching. Exam consistent with yeast but the prep looks like early bacterial vaginosis. Will treat with Flagyl 500 mg twice a day x7 days, alcohol avoidance reviewed. Given the clinical history and observation will cover for yeast with Diflucan 150 mg x1 dose. Followup if symptoms persist, worsen or recur.

## 2013-06-15 LAB — URINE CULTURE: Colony Count: >=100000

## 2013-08-13 ENCOUNTER — Other Ambulatory Visit: Payer: Self-pay | Admitting: Family Medicine

## 2013-08-13 ENCOUNTER — Telehealth: Payer: Self-pay | Admitting: Family Medicine

## 2013-08-13 NOTE — Telephone Encounter (Signed)
Refill once.  Office follow up at some point this year.

## 2013-08-13 NOTE — Telephone Encounter (Signed)
Last visit 07/24/12 Last refill 07/14/12 #30 0 refill

## 2013-08-13 NOTE — Telephone Encounter (Signed)
Pt needs refill on generic ambien 10 mg sent to [facility]

## 2013-08-14 MED ORDER — ZOLPIDEM TARTRATE 10 MG PO TABS: 10.0000 mg | ORAL_TABLET | Freq: Every evening | ORAL | Status: DC | PRN

## 2013-08-14 NOTE — Telephone Encounter (Signed)
RX called into pharmacy

## 2014-01-02 ENCOUNTER — Telehealth: Payer: Self-pay | Admitting: Family Medicine

## 2014-01-02 NOTE — Telephone Encounter (Signed)
Refill once OK. 

## 2014-01-02 NOTE — Telephone Encounter (Signed)
Last visit 05/17/13 Last refill 08/14/13 #30 0 refill

## 2014-01-02 NOTE — Telephone Encounter (Signed)
[id number] [place], [place] - [place] is requesting re-fill on zolpidem (AMBIEN) 10 MG tablet

## 2014-01-03 MED ORDER — ZOLPIDEM TARTRATE 10 MG PO TABS: 10.0000 mg | ORAL_TABLET | Freq: Every evening | ORAL | Status: DC | PRN

## 2014-01-03 NOTE — Telephone Encounter (Signed)
RX called in .

## 2014-03-01 ENCOUNTER — Other Ambulatory Visit: Payer: Self-pay | Admitting: Gynecology

## 2014-03-05 ENCOUNTER — Other Ambulatory Visit: Payer: Self-pay | Admitting: Gynecology

## 2014-03-06 NOTE — Telephone Encounter (Signed)
Has CE scheduled 04/17/14.

## 2014-04-08 ENCOUNTER — Encounter: Payer: Self-pay | Admitting: Gynecology

## 2014-04-17 ENCOUNTER — Encounter: Payer: Self-pay | Admitting: Gynecology

## 2014-04-17 ENCOUNTER — Other Ambulatory Visit (HOSPITAL_COMMUNITY)
Admission: RE | Admit: 2014-04-17 | Discharge: 2014-04-17 | Disposition: A | Discharge status: Home / Self Care | Payer: BC Managed Care – PPO | Source: Ambulatory Visit | Attending: Gynecology | Admitting: Gynecology

## 2014-04-17 ENCOUNTER — Ambulatory Visit (INDEPENDENT_AMBULATORY_CARE_PROVIDER_SITE_OTHER): Payer: BC Managed Care – PPO | Admitting: Gynecology

## 2014-04-17 VITALS — BP 120/76 | Ht 64.0 in | Wt 174.0 lb

## 2014-04-17 DIAGNOSIS — Z01419 Encounter for gynecological examination (general) (routine) without abnormal findings: Secondary | ICD-10-CM | POA: Insufficient documentation

## 2014-04-17 DIAGNOSIS — Z1151 Encounter for screening for human papillomavirus (HPV): Secondary | ICD-10-CM | POA: Diagnosis present

## 2014-04-17 DIAGNOSIS — Z309 Encounter for contraceptive management, unspecified: Secondary | ICD-10-CM

## 2014-04-17 LAB — CBC WITH DIFFERENTIAL/PLATELET
Basophils Absolute: 0.1 10*3/uL (ref 0.0–0.1)
Basophils Relative: 1 % (ref 0–1)
Eosinophils Absolute: 0.1 10*3/uL (ref 0.0–0.7)
Eosinophils Relative: 1 % (ref 0–5)
HCT: 39.6 % (ref 36.0–46.0)
HEMOGLOBIN: 13.7 g/dL (ref 12.0–15.0)
LYMPHS ABS: 1.5 10*3/uL (ref 0.7–4.0)
LYMPHS PCT: 29 % (ref 12–46)
MCH: 31.6 pg (ref 26.0–34.0)
MCHC: 34.6 g/dL (ref 30.0–36.0)
MCV: 91.2 fL (ref 78.0–100.0)
MONOS PCT: 7 % (ref 3–12)
Monocytes Absolute: 0.4 10*3/uL (ref 0.1–1.0)
NEUTROS ABS: 3.1 10*3/uL (ref 1.7–7.7)
NEUTROS PCT: 62 % (ref 43–77)
Platelets: 205 10*3/uL (ref 150–400)
RBC: 4.34 MIL/uL (ref 3.87–5.11)
RDW: 12.9 % (ref 11.5–15.5)
WBC: 5 10*3/uL (ref 4.0–10.5)

## 2014-04-17 LAB — COMPREHENSIVE METABOLIC PANEL
ALBUMIN: 4 g/dL (ref 3.5–5.2)
ALT: 11 U/L (ref 0–35)
AST: 12 U/L (ref 0–37)
Alkaline Phosphatase: 36 U/L — ABNORMAL LOW (ref 39–117)
BUN: 14 mg/dL (ref 6–23)
CHLORIDE: 105 meq/L (ref 96–112)
CO2: 21 meq/L (ref 19–32)
Calcium: 8.9 mg/dL (ref 8.4–10.5)
Creat: 0.98 mg/dL (ref 0.50–1.10)
GLUCOSE: 92 mg/dL (ref 70–99)
POTASSIUM: 4.3 meq/L (ref 3.5–5.3)
Sodium: 137 mEq/L (ref 135–145)
TOTAL PROTEIN: 6.5 g/dL (ref 6.0–8.3)
Total Bilirubin: 0.5 mg/dL (ref 0.2–1.2)

## 2014-04-17 LAB — LIPID PANEL
CHOLESTEROL: 194 mg/dL (ref 0–200)
HDL: 59 mg/dL (ref >39)
LDL Cholesterol: 106 mg/dL — ABNORMAL HIGH (ref 0–99)
TRIGLYCERIDES: 146 mg/dL (ref <150)
Total CHOL/HDL Ratio: 3.3 Ratio
VLDL: 29 mg/dL (ref 0–40)

## 2014-04-17 MED ORDER — NORETHIN ACE-ETH ESTRAD-FE 1.5-30 MG-MCG PO TABS: ORAL_TABLET | ORAL | Status: DC

## 2014-04-17 NOTE — Progress Notes (Signed)
Colleen Martinez Aug 05, 1960 [id number]        53 y.o.  G3P0030 for annual exam.  Several issues noted below.  Past medical history,surgical history, problem list, medications, allergies, family history and social history were all reviewed and documented as reviewed in the EPIC chart.  ROS:  12 system ROS performed with pertinent positives and negatives included in the history, assessment and plan.   Additional significant findings :  none   Exam: Kim Counsellor Vitals:   04/17/14 0904  BP: 120/76  Height: 5\' 4"  (1.626 m)  Weight: 174 lb (78.926 kg)   General appearance:  Normal affect, orientation and appearance. Skin: Grossly normal HEENT: Without gross lesions.  No cervical or supraclavicular adenopathy. Thyroid normal.  Lungs:  Clear without wheezing, rales or rhonchi Cardiac: RR, without RMG Abdominal:  Soft, nontender, without masses, guarding, rebound, organomegaly or hernia Breasts:  Examined lying and sitting without masses, retractions, discharge or axillary adenopathy.  Bilateral implants noted Pelvic:  Ext/BUS/vagina normal  Cervix with stenosis. Pap/HPV  Uterus anteverted, normal size, shape and contour, midline and mobile nontender   Adnexa  Without masses or tenderness    Anus and perineum  Normal   Rectovaginal  Normal sphincter tone without palpated masses or tenderness.    Assessment/Plan:  53 y.o. G57P0030 female for annual exam with regular menses, oral contraceptives.   1. Contraceptive management. Patient continues on low-dose oral contraceptives. Has light monthly menses. [place] during pill free week last year was normal. Options to include stopping pills with exception, continuing pills for another year and returning during the pill free week for [facility] check all reviewed. Patient wants to return during the pill free week to check her [facility] and will do so over the next several months. Future order was placed. Does want to continue on pills for now and annual  refill for her Junel 1.5/30 written.  Never smoked and not being followed for any vascular medical issues. Increased risk of stroke, heart attack, DVT with birth control pills and advancing age reviewed. 2. Mammography do now and I reminded her to schedule this and she agrees to do so. SBE monthly reviewed. 3. Pap smear 2012. Pap/HPV today.  History of cryosurgery 2007 for LGSIL with normal Pap smears since then.  Assuming Pap smear normal then plan repeat at 3-5 year interval per current screening guidelines. 4. Hypothyroid. Patient being managed by Dr. Chalmers Cater 5. Health maintenance. Baseline CBC comprehensive metabolic panel lipid profile urinalysis ordered. Follow up for [facility] as noted above otherwise 1 year.     Anastasio Auerbach MD, 9:31 AM 04/17/2014

## 2014-04-17 NOTE — Patient Instructions (Signed)
Return during the pill free week of your birth control pills to check your hormone level whenever it is convenient.   You may obtain a copy of any labs that were done today by logging onto MyChart as outlined in the instructions provided with your AVS (after visit summary). The office will not call with normal lab results but certainly if there are any significant abnormalities then we will contact you.   Health Maintenance, Female A healthy lifestyle and preventative care can promote health and wellness.  Maintain regular health, dental, and eye exams.  Eat a healthy diet. Foods like vegetables, fruits, whole grains, low-fat dairy products, and lean protein foods contain the nutrients you need without too many calories. Decrease your intake of foods high in solid fats, added sugars, and salt. Get information about a proper diet from your caregiver, if necessary.  Regular physical exercise is one of the most important things you can do for your health. Most adults should get at least 150 minutes of moderate-intensity exercise (any activity that increases your heart rate and causes you to sweat) each week. In addition, most adults need muscle-strengthening exercises on 2 or more days a week.   Maintain a healthy weight. The body mass index (BMI) is a screening tool to identify possible weight problems. It provides an estimate of body fat based on height and weight. Your caregiver can help determine your BMI, and can help you achieve or maintain a healthy weight. For adults 20 years and older:  A BMI below 18.5 is considered underweight.  A BMI of 18.5 to 24.9 is normal.  A BMI of 25 to 29.9 is considered overweight.  A BMI of 30 and above is considered obese.  Maintain normal blood lipids and cholesterol by exercising and minimizing your intake of saturated fat. Eat a balanced diet with plenty of fruits and vegetables. Blood tests for lipids and cholesterol should begin at age 77 and be  repeated every 5 years. If your lipid or cholesterol levels are high, you are over 50, or you are a high risk for heart disease, you may need your cholesterol levels checked more frequently.Ongoing high lipid and cholesterol levels should be treated with medicines if diet and exercise are not effective.  If you smoke, find out from your caregiver how to quit. If you do not use tobacco, do not start.  Lung cancer screening is recommended for adults aged 54 80 years who are at high risk for developing lung cancer because of a history of smoking. Yearly low-dose computed tomography (CT) is recommended for people who have at least a 30-pack-year history of smoking and are a current smoker or have quit within the past 15 years. A pack year of smoking is smoking an average of 1 pack of cigarettes a day for 1 year (for example: 1 pack a day for 30 years or 2 packs a day for 15 years). Yearly screening should continue until the smoker has stopped smoking for at least 15 years. Yearly screening should also be stopped for people who develop a health problem that would prevent them from having lung cancer treatment.  If you are pregnant, do not drink alcohol. If you are breastfeeding, be very cautious about drinking alcohol. If you are not pregnant and choose to drink alcohol, do not exceed 1 drink per day. One drink is considered to be 12 ounces (355 mL) of beer, 5 ounces (148 mL) of wine, or 1.5 ounces (44 mL) of liquor.  Avoid use of street drugs. Do not share needles with anyone. Ask for help if you need support or instructions about stopping the use of drugs.  High blood pressure causes heart disease and increases the risk of stroke. Blood pressure should be checked at least every 1 to 2 years. Ongoing high blood pressure should be treated with medicines, if weight loss and exercise are not effective.  If you are 44 to 51 years old, ask your caregiver if you should take aspirin to prevent strokes.  Diabetes  screening involves taking a blood sample to check your fasting blood sugar level. This should be done once every 3 years, after age 54, if you are within normal weight and without risk factors for diabetes. Testing should be considered at a younger age or be carried out more frequently if you are overweight and have at least 1 risk factor for diabetes.  Breast cancer screening is essential preventative care for women. You should practice "breast self-awareness." This means understanding the normal appearance and feel of your breasts and may include breast self-examination. Any changes detected, no matter how small, should be reported to a caregiver. Women in their 31s and 30s should have a clinical breast exam (CBE) by a caregiver as part of a regular health exam every 1 to 3 years. After age 12, women should have a CBE every year. Starting at age 70, women should consider having a mammogram (breast X-ray) every year. Women who have a family history of breast cancer should talk to their caregiver about genetic screening. Women at a high risk of breast cancer should talk to their caregiver about having an MRI and a mammogram every year.  Breast cancer gene (BRCA)-related cancer risk assessment is recommended for women who have family members with BRCA-related cancers. BRCA-related cancers include breast, ovarian, tubal, and peritoneal cancers. Having family members with these cancers may be associated with an increased risk for harmful changes (mutations) in the breast cancer genes BRCA1 and BRCA2. Results of the assessment will determine the need for genetic counseling and BRCA1 and BRCA2 testing.  The Pap test is a screening test for cervical cancer. Women should have a Pap test starting at age 35. Between ages 63 and 55, Pap tests should be repeated every 2 years. Beginning at age 24, you should have a Pap test every 3 years as long as the past 3 Pap tests have been normal. If you had a hysterectomy for a  problem that was not cancer or a condition that could lead to cancer, then you no longer need Pap tests. If you are between ages 62 and 39, and you have had normal Pap tests going back 10 years, you no longer need Pap tests. If you have had past treatment for cervical cancer or a condition that could lead to cancer, you need Pap tests and screening for cancer for at least 20 years after your treatment. If Pap tests have been discontinued, risk factors (such as a new sexual partner) need to be reassessed to determine if screening should be resumed. Some women have medical problems that increase the chance of getting cervical cancer. In these cases, your caregiver may recommend more frequent screening and Pap tests.  The human papillomavirus (HPV) test is an additional test that may be used for cervical cancer screening. The HPV test looks for the virus that can cause the cell changes on the cervix. The cells collected during the Pap test can be tested for HPV. The  HPV test could be used to screen women aged 30 years and older, and should be used in women of any age who have unclear Pap test results. After the age of 63, women should have HPV testing at the same frequency as a Pap test.  Colorectal cancer can be detected and often prevented. Most routine colorectal cancer screening begins at the age of 54 and continues through age 62. However, your caregiver may recommend screening at an earlier age if you have risk factors for colon cancer. On a yearly basis, your caregiver may provide home test kits to check for hidden blood in the stool. Use of a small camera at the end of a tube, to directly examine the colon (sigmoidoscopy or colonoscopy), can detect the earliest forms of colorectal cancer. Talk to your caregiver about this at age 31, when routine screening begins. Direct examination of the colon should be repeated every 5 to 10 years through age 42, unless early forms of pre-cancerous polyps or small growths  are found.  Hepatitis C blood testing is recommended for all people born from [birth year] through 1965 and any individual with known risks for hepatitis C.  Practice safe sex. Use condoms and avoid high-risk sexual practices to reduce the spread of sexually transmitted infections (STIs). Sexually active women aged 4 and younger should be checked for Chlamydia, which is a common sexually transmitted infection. Older women with new or multiple partners should also be tested for Chlamydia. Testing for other STIs is recommended if you are sexually active and at increased risk.  Osteoporosis is a disease in which the bones lose minerals and strength with aging. This can result in serious bone fractures. The risk of osteoporosis can be identified using a bone density scan. Women ages 28 and over and women at risk for fractures or osteoporosis should discuss screening with their caregivers. Ask your caregiver whether you should be taking a calcium supplement or vitamin D to reduce the rate of osteoporosis.  Menopause can be associated with physical symptoms and risks. Hormone replacement therapy is available to decrease symptoms and risks. You should talk to your caregiver about whether hormone replacement therapy is right for you.  Use sunscreen. Apply sunscreen liberally and repeatedly throughout the day. You should seek shade when your shadow is shorter than you. Protect yourself by wearing long sleeves, pants, a wide-brimmed hat, and sunglasses year round, whenever you are outdoors.  Notify your caregiver of new moles or changes in moles, especially if there is a change in shape or color. Also notify your caregiver if a mole is larger than the size of a pencil eraser.  Stay current with your immunizations. Document Released: 12/07/2010 Document Revised: 09/18/2012 Document Reviewed: 12/07/2010 [facility] Patient Information 2014 [place].

## 2014-04-17 NOTE — Addendum Note (Signed)
Addended by: Nelva Nay on: 04/17/2014 10:02 AM   Modules accepted: Orders, SmartSet

## 2014-04-18 LAB — URINALYSIS W MICROSCOPIC + REFLEX CULTURE
BILIRUBIN URINE: NEGATIVE
CRYSTALS: NONE SEEN
Casts: NONE SEEN
Glucose, UA: NEGATIVE mg/dL
KETONES UR: NEGATIVE mg/dL
Leukocytes, UA: NEGATIVE
NITRITE: NEGATIVE
Protein, ur: NEGATIVE mg/dL
SPECIFIC GRAVITY, URINE: 1.024 (ref 1.005–1.030)
Urobilinogen, UA: 0.2 mg/dL (ref 0.0–1.0)
pH: 5 (ref 5.0–8.0)

## 2014-04-18 LAB — CYTOLOGY - PAP

## 2014-04-19 LAB — URINE CULTURE
Colony Count: NO GROWTH
ORGANISM ID, BACTERIA: NO GROWTH

## 2014-07-23 ENCOUNTER — Encounter: Payer: Self-pay | Admitting: Women's Health

## 2014-07-23 ENCOUNTER — Ambulatory Visit (INDEPENDENT_AMBULATORY_CARE_PROVIDER_SITE_OTHER): Payer: BLUE CROSS/BLUE SHIELD | Admitting: Women's Health

## 2014-07-23 DIAGNOSIS — B3731 Acute candidiasis of vulva and vagina: Secondary | ICD-10-CM

## 2014-07-23 DIAGNOSIS — B373 Candidiasis of vulva and vagina: Secondary | ICD-10-CM

## 2014-07-23 LAB — WET PREP FOR TRICH, YEAST, CLUE
CLUE CELLS WET PREP: NONE SEEN
TRICH WET PREP: NONE SEEN

## 2014-07-23 MED ORDER — FLUCONAZOLE 150 MG PO TABS: 150.0000 mg | ORAL_TABLET | Freq: Once | ORAL | Status: DC

## 2014-07-23 NOTE — Progress Notes (Signed)
Patient ID: Colleen Martinez, female   DOB: Oct 09, 1960, 54 y.o.   MRN: [id number] Presents with complaint of vaginal itching and irritation for several days. States will often get vaginal itching after her cycle, has a light monthly cycle on Loestrin. Denies urinary symptoms, abdominal pain or fever.  Exam: Appears well. External genitalia extremely erythematous at introitus, speculum exam scant curdy discharge noted wet prep positive for moderate yeast. Bimanual no CMT or adnexal fullness or tenderness.  Yeast vaginitis  Plan: Diflucan 150 by mouth times one dose with refill. Yeast prevention discussed, instructed to call if no relief of symptoms.

## 2014-07-23 NOTE — Patient Instructions (Signed)

## 2014-08-14 ENCOUNTER — Ambulatory Visit (INDEPENDENT_AMBULATORY_CARE_PROVIDER_SITE_OTHER): Payer: BLUE CROSS/BLUE SHIELD | Admitting: Family Medicine

## 2014-08-14 ENCOUNTER — Encounter: Payer: Self-pay | Admitting: Family Medicine

## 2014-08-14 DIAGNOSIS — J029 Acute pharyngitis, unspecified: Secondary | ICD-10-CM

## 2014-08-14 LAB — POCT RAPID STREP A (OFFICE): RAPID STREP A SCREEN: NEGATIVE

## 2014-08-14 NOTE — Progress Notes (Signed)
   Subjective:    Patient ID: Colleen Martinez, female    DOB: 1961-04-14, 54 y.o.   MRN: [id number]  HPI   Acute visit. Patient is concerned that she's had some infection regarding her right tonsil recently. She's had sore throat past few days. No fever. Mild malaise. Occasional cough. She thought she saw some exudate this morning on the right tonsillar region. No adenopathy. Doing saltwater gargles with some relief.  Past Medical History  Diagnosis Date  . Chicken pox   . Heart murmur     functional  . 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 Thyroid disease     Hypothyroidism   Past Surgical History  Procedure Laterality Date  . Breast surgery  2004    breast augmentation  . Liposuction  2004    stomach , bi-lat thighs  . Gynecologic cryosurgery  2007    LGSIL normal paps since  . Augmentation mammaplasty      reports that she has never smoked. She has never used smokeless tobacco. She reports that she drinks about 0.5 oz of alcohol per week. She reports that she does not use illicit drugs. family history includes Arthritis in her father and mother; Hyperlipidemia in her father and mother; Ovarian cancer in her mother. No Known Allergies    Review of Systems  Constitutional: Negative for fever and chills.  HENT: Positive for congestion and sore throat.   Respiratory: Positive for cough.        Objective:   Physical Exam  Constitutional: She appears well-developed and well-nourished.  HENT:  Right Ear: External ear normal.  Left Ear: External ear normal.  Mouth/Throat: Oropharynx is clear and moist.  Neck: Neck supple.  Cardiovascular: Normal rate and regular rhythm.   No murmur heard. Pulmonary/Chest: Effort normal and breath sounds normal. No respiratory distress. She has no wheezes. She has no rales.  Lymphadenopathy:    She has no cervical adenopathy.          Assessment & Plan:  Pharyngitis. Rapid strep negative. Suspect viral. Treat symptomatically.  Continue saltwater gargles

## 2014-08-14 NOTE — Progress Notes (Signed)
Pre visit review using our clinic review tool, if applicable. No additional management support is needed unless otherwise documented below in the visit note. 

## 2014-08-14 NOTE — Patient Instructions (Signed)

## 2014-09-03 ENCOUNTER — Ambulatory Visit (INDEPENDENT_AMBULATORY_CARE_PROVIDER_SITE_OTHER): Payer: BLUE CROSS/BLUE SHIELD | Admitting: Family Medicine

## 2014-09-03 ENCOUNTER — Encounter: Payer: Self-pay | Admitting: Family Medicine

## 2014-09-03 VITALS — BP 140/84 | HR 83 | Temp 98.0°F | Ht 64.0 in | Wt 177.5 lb

## 2014-09-03 DIAGNOSIS — J019 Acute sinusitis, unspecified: Secondary | ICD-10-CM | POA: Diagnosis not present

## 2014-09-03 MED ORDER — AMOXICILLIN 875 MG PO TABS: 875.0000 mg | ORAL_TABLET | Freq: Two times a day (BID) | ORAL | Status: DC

## 2014-09-03 NOTE — Progress Notes (Signed)
HPI:  URI: -started: 4 days ago -symptoms:nasal congestion, sore throat, cough, bridge of nose is sore, congestion, PND -denies: fever, SOB, NVD, tooth pain -has tried: nothing -sick contacts/travel/risks: denies flu exposure, tick exposure or or Ebola risks -Hx of: seasonal allergies -reports hx of sinusitis and wanted to get in today as is leaving for [place] in a few days  ROS: See pertinent positives and negatives per HPI.  Past Medical History  Diagnosis Date  . Chicken pox   . Heart murmur     functional  . 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 Thyroid disease     Hypothyroidism    Past Surgical History  Procedure Laterality Date  . Breast surgery  2004    breast augmentation  . Liposuction  2004    stomach , bi-lat thighs  . Gynecologic cryosurgery  2007    LGSIL normal paps since  . Augmentation mammaplasty      Family History  Problem Relation Age of Onset  . Arthritis Mother   . Hyperlipidemia Mother   . Ovarian cancer Mother   . Arthritis Father   . Hyperlipidemia Father     History   Social History  . Marital Status: Married    Spouse Name: N/A  . Number of Children: N/A  . Years of Education: N/A   Social History Main Topics  . Smoking status: Never Smoker   . Smokeless tobacco: Never Used  . Alcohol Use: 0.5 oz/week    1 Standard drinks or equivalent per week     Comment: social  . Drug Use: No  . Sexual Activity:    Partners: Male    Birth Control/ Protection: Pill   Other Topics Concern  . None   Social History Narrative     Current outpatient prescriptions:  .  fish oil-omega-3 fatty acids 1000 MG capsule, Take 2 g by mouth as needed.  , Disp: , Rfl:  .  fluconazole (DIFLUCAN) 150 MG tablet, Take 1 tablet (150 mg total) by mouth once., Disp: 1 tablet, Rfl: 1 .  levothyroxine (SYNTHROID, LEVOTHROID) 75 MCG tablet, Take 75 mcg by mouth daily before breakfast. , Disp: , Rfl:  .  norethindrone-ethinyl estradiol-iron (JUNEL FE  1.5/30) 1.5-30 MG-MCG tablet, TAKE 1 TABLET BY MOUTH DAILY., Disp: 28 tablet, Rfl: 12 .  zolpidem (AMBIEN) 10 MG tablet, Take 1 tablet (10 mg total) by mouth at bedtime as needed for sleep., Disp: 30 tablet, Rfl: 0 .  amoxicillin (AMOXIL) 875 MG tablet, Take 1 tablet (875 mg total) by mouth 2 (two) times daily., Disp: 20 tablet, Rfl: 0  EXAM:  Filed Vitals:   09/03/14 0926  BP: 140/84  Pulse: 83  Temp: 98 F (36.7 C)    Body mass index is 30.45 kg/(m^2).  GENERAL: vitals reviewed and listed above, alert, oriented, appears well hydrated and in no acute distress  HEENT: atraumatic, conjunttiva clear, no obvious abnormalities on inspection of external nose and ears, normal appearance of ear canals and TMs, clear nasal congestion, mild post oropharyngeal erythema with PND, no tonsillar edema or exudate, no sinus TTP  NECK: no obvious masses on inspection  LUNGS: clear to auscultation bilaterally, no wheezes, rales or rhonchi, good air movement  CV: HRRR, no peripheral edema  MS: moves all extremities without noticeable abnormality  PSYCH: pleasant and cooperative, no obvious depression or anxiety  ASSESSMENT AND PLAN:  Discussed the following assessment and plan:  Acute sinusitis, recurrence not specified, unspecified location  -  given HPI and exam findings today, a serious infection or illness is unlikely. We discussed potential etiologies, with VURI being most likely, and advised supportive care and monitoring. We discussed treatment side effects, likely course, antibiotic misuse, transmission, and signs of developing a serious illness. -she is concerned about a bacterial sinus infection - delayed abx provided since leaving on a trip and reasons for starting the abx discussed -of course, we advised to return or notify a doctor immediately if symptoms worsen or persist or new concerns arise.    Patient Instructions  INSTRUCTIONS FOR UPPER RESPIRATORY INFECTION:  -plenty of  rest and fluids  -nasal saline wash 2-3 times daily (use prepackaged nasal saline or bottled/distilled water if making your own)   -clean nose with nasal saline before using the nasal steroid or sinex  -flonase daily for 21 days  -can use AFRIN nasal spray for drainage and nasal congestion - but do NOT use longer then 3-4 days  -can use tylenol or ibuprofen as directed for aches and sorethroat  -in the winter time, using a humidifier at night is helpful (please follow cleaning instructions)  -if you are taking a cough medication - use only as directed, may also try a teaspoon of honey to coat the throat and throat lozenges  -for sore throat, salt water gargles can help  -start the antibiotic if you have fevers, worsening facial pain, tooth pain, difficulty breathing or are worsening or not getting better in 5-7 days      Revanth Neidig R.

## 2014-09-03 NOTE — Patient Instructions (Signed)
INSTRUCTIONS FOR UPPER RESPIRATORY INFECTION:  -plenty of rest and fluids  -nasal saline wash 2-3 times daily (use prepackaged nasal saline or bottled/distilled water if making your own)   -clean nose with nasal saline before using the nasal steroid or sinex  -flonase daily for 21 days  -can use AFRIN nasal spray for drainage and nasal congestion - but do NOT use longer then 3-4 days  -can use tylenol or ibuprofen as directed for aches and sorethroat  -in the winter time, using a humidifier at night is helpful (please follow cleaning instructions)  -if you are taking a cough medication - use only as directed, may also try a teaspoon of honey to coat the throat and throat lozenges  -for sore throat, salt water gargles can help  -start the antibiotic if you have fevers, worsening facial pain, tooth pain, difficulty breathing or are worsening or not getting better in 5-7 days

## 2014-09-03 NOTE — Progress Notes (Signed)
Pre visit review using our clinic review tool, if applicable. No additional management support is needed unless otherwise documented below in the visit note. 

## 2014-10-09 ENCOUNTER — Encounter: Payer: Self-pay | Admitting: Family Medicine

## 2014-10-09 ENCOUNTER — Ambulatory Visit (INDEPENDENT_AMBULATORY_CARE_PROVIDER_SITE_OTHER): Payer: BLUE CROSS/BLUE SHIELD | Admitting: Family Medicine

## 2014-10-09 VITALS — BP 169/103 | HR 73 | Temp 98.7°F | Wt 178.0 lb

## 2014-10-09 DIAGNOSIS — J0100 Acute maxillary sinusitis, unspecified: Secondary | ICD-10-CM | POA: Diagnosis not present

## 2014-10-09 MED ORDER — HYDROCODONE-HOMATROPINE 5-1.5 MG/5ML PO SYRP: 5.0000 mL | ORAL_SOLUTION | ORAL | Status: DC | PRN

## 2014-10-09 MED ORDER — AZITHROMYCIN 250 MG PO TABS: ORAL_TABLET | ORAL | Status: DC

## 2014-10-09 NOTE — Progress Notes (Signed)
Pre visit review using our clinic review tool, if applicable. No additional management support is needed unless otherwise documented below in the visit note. 

## 2014-10-09 NOTE — Progress Notes (Signed)
   Subjective:    Patient ID: Colleen Martinez, female    DOB: 06/04/1961, 54 y.o.   MRN: [id number]  HPI Here for 6 days of low grade fevers, ST, dry cough, and sinus pressure. Taking Advil.    Review of Systems  Constitutional: Positive for fever.  HENT: Positive for congestion and sinus pressure.   Eyes: Negative.   Respiratory: Positive for cough.        Objective:   Physical Exam  Constitutional: She appears well-developed and well-nourished.  HENT:  Right Ear: External ear normal.  Left Ear: External ear normal.  Nose: Nose normal.  Mouth/Throat: Oropharynx is clear and moist.  Eyes: Conjunctivae are normal.  Pulmonary/Chest: Effort normal and breath sounds normal. No respiratory distress. She has no wheezes. She has no rales.  Lymphadenopathy:    She has no cervical adenopathy.          Assessment & Plan:  Viral URI that is evolving into a sinusitis. Treat with a Zpack. Written out of work for today and tomorrow.

## 2015-01-31 ENCOUNTER — Other Ambulatory Visit: Payer: BLUE CROSS/BLUE SHIELD

## 2015-01-31 DIAGNOSIS — Z01419 Encounter for gynecological examination (general) (routine) without abnormal findings: Secondary | ICD-10-CM

## 2015-02-01 LAB — FOLLICLE STIMULATING HORMONE: FSH: 1.3 m[IU]/mL

## 2015-03-17 ENCOUNTER — Other Ambulatory Visit: Payer: Self-pay

## 2015-03-17 DIAGNOSIS — Z1231 Encounter for screening mammogram for malignant neoplasm of breast: Secondary | ICD-10-CM

## 2015-04-08 ENCOUNTER — Ambulatory Visit
Admission: RE | Admit: 2015-04-08 | Discharge: 2015-04-08 | Disposition: A | Discharge status: Home / Self Care | Payer: BLUE CROSS/BLUE SHIELD | Source: Ambulatory Visit

## 2015-04-08 DIAGNOSIS — Z1231 Encounter for screening mammogram for malignant neoplasm of breast: Secondary | ICD-10-CM

## 2015-04-17 ENCOUNTER — Other Ambulatory Visit: Payer: Self-pay | Admitting: Gynecology

## 2015-05-07 ENCOUNTER — Ambulatory Visit (INDEPENDENT_AMBULATORY_CARE_PROVIDER_SITE_OTHER): Payer: BLUE CROSS/BLUE SHIELD | Admitting: Gynecology

## 2015-05-07 ENCOUNTER — Encounter: Payer: Self-pay | Admitting: Gynecology

## 2015-05-07 VITALS — BP 142/80 | Ht 64.0 in | Wt 175.0 lb

## 2015-05-07 DIAGNOSIS — Z01419 Encounter for gynecological examination (general) (routine) without abnormal findings: Secondary | ICD-10-CM | POA: Diagnosis not present

## 2015-05-07 DIAGNOSIS — Z1321 Encounter for screening for nutritional disorder: Secondary | ICD-10-CM

## 2015-05-07 DIAGNOSIS — M5440 Lumbago with sciatica, unspecified side: Secondary | ICD-10-CM

## 2015-05-07 DIAGNOSIS — G4700 Insomnia, unspecified: Secondary | ICD-10-CM

## 2015-05-07 DIAGNOSIS — Z1329 Encounter for screening for other suspected endocrine disorder: Secondary | ICD-10-CM

## 2015-05-07 DIAGNOSIS — Z13220 Encounter for screening for lipoid disorders: Secondary | ICD-10-CM

## 2015-05-07 LAB — LIPID PANEL
CHOLESTEROL: 221 mg/dL — AB (ref 125–200)
HDL: 60 mg/dL (ref >=46)
LDL Cholesterol: 123 mg/dL (ref <130)
Total CHOL/HDL Ratio: 3.7 Ratio (ref <=5.0)
Triglycerides: 191 mg/dL — ABNORMAL HIGH (ref <150)
VLDL: 38 mg/dL — AB (ref <30)

## 2015-05-07 LAB — CBC WITH DIFFERENTIAL/PLATELET
BASOS PCT: 0 % (ref 0–1)
Basophils Absolute: 0 10*3/uL (ref 0.0–0.1)
EOS PCT: 2 % (ref 0–5)
Eosinophils Absolute: 0.1 10*3/uL (ref 0.0–0.7)
HCT: 41.9 % (ref 36.0–46.0)
HEMOGLOBIN: 13.8 g/dL (ref 12.0–15.0)
Lymphocytes Relative: 26 % (ref 12–46)
Lymphs Abs: 1.2 10*3/uL (ref 0.7–4.0)
MCH: 31.3 pg (ref 26.0–34.0)
MCHC: 32.9 g/dL (ref 30.0–36.0)
MCV: 95 fL (ref 78.0–100.0)
MONO ABS: 0.4 10*3/uL (ref 0.1–1.0)
MONOS PCT: 9 % (ref 3–12)
MPV: 11 fL (ref 8.6–12.4)
NEUTROS ABS: 3 10*3/uL (ref 1.7–7.7)
Neutrophils Relative %: 63 % (ref 43–77)
Platelets: 220 10*3/uL (ref 150–400)
RBC: 4.41 MIL/uL (ref 3.87–5.11)
RDW: 12.9 % (ref 11.5–15.5)
WBC: 4.7 10*3/uL (ref 4.0–10.5)

## 2015-05-07 LAB — COMPREHENSIVE METABOLIC PANEL
ALT: 11 U/L (ref 6–29)
AST: 15 U/L (ref 10–35)
Albumin: 3.9 g/dL (ref 3.6–5.1)
Alkaline Phosphatase: 34 U/L (ref 33–130)
BUN: 13 mg/dL (ref 7–25)
CALCIUM: 8.9 mg/dL (ref 8.6–10.4)
CHLORIDE: 105 mmol/L (ref 98–110)
CO2: 23 mmol/L (ref 20–31)
CREATININE: 0.97 mg/dL (ref 0.50–1.05)
Glucose, Bld: 96 mg/dL (ref 65–99)
Potassium: 4.7 mmol/L (ref 3.5–5.3)
SODIUM: 139 mmol/L (ref 135–146)
Total Bilirubin: 0.6 mg/dL (ref 0.2–1.2)
Total Protein: 6.7 g/dL (ref 6.1–8.1)

## 2015-05-07 LAB — TSH: TSH: 3.069 u[IU]/mL (ref 0.350–4.500)

## 2015-05-07 MED ORDER — TRAMADOL HCL 50 MG PO TABS: 50.0000 mg | ORAL_TABLET | Freq: Four times a day (QID) | ORAL | Status: DC | PRN

## 2015-05-07 MED ORDER — ZOLPIDEM TARTRATE 10 MG PO TABS: 10.0000 mg | ORAL_TABLET | Freq: Every evening | ORAL | Status: DC | PRN

## 2015-05-07 MED ORDER — NORETHIN ACE-ETH ESTRAD-FE 1.5-30 MG-MCG PO TABS: 1.0000 | ORAL_TABLET | Freq: Every day | ORAL | Status: DC

## 2015-05-07 NOTE — Progress Notes (Signed)
Colleen Martinez 1960-11-21 [id number]        54 y.o.  G3P0030  Patient's last menstrual period was 03/23/2015. for annual exam.  Several issues noted below.  Past medical history,surgical history, problem list, medications, allergies, family history and social history were all reviewed and documented as reviewed in the EPIC chart.  ROS:  Performed with pertinent positives and negatives included in the history, assessment and plan.   Additional significant findings :  Low back pain as discussed below   Exam: Wandra Scot assistant Filed Vitals:   05/07/15 0802  BP: 142/80  Height: 5\' 4"  (1.626 m)  Weight: 175 lb (79.379 kg)   General appearance:  Normal affect, orientation and appearance. Skin: Grossly normal HEENT: Without gross lesions.  No cervical or supraclavicular adenopathy. Thyroid normal.  Lungs:  Clear without wheezing, rales or rhonchi Cardiac: RR, without RMG Abdominal:  Soft, nontender, without masses, guarding, rebound, organomegaly or hernia Breasts:  Examined lying and sitting without masses, retractions, discharge or axillary adenopathy.  Bilateral implants noted Pelvic:  Ext/BUS/vagina normal  Cervix normal  Uterus anteverted, normal size, shape and contour, midline and mobile nontender   Adnexa  Without masses or tenderness    Anus and perineum  Normal   Rectovaginal  Normal sphincter tone without palpated masses or tenderness.    Assessment/Plan:  54 y.o. G25P0030 female for annual exam with regular menses, oral contraceptives.   1. Oral contraceptives.  Continues on Loestrin 1/20 equivalent. Recent FSH pill free week normal in August. Wants to continue on the pills. Blood pressure today 142/80. Discussed need to recheck her blood pressure make sure is normal. Risks of pills to include stroke heart attack DVT reviewed. Refill 2 months provided to allow for blood pressure recheck before annual prescription written. Patient will have this faxed to [place] from  work. 2. Low back pain. On and off over the past year. Saw neurologist who was treating her with medication but no studies done. Does have some left sciatica type history. Recommend follow up with orthopedics and she is going to arrange this.  Tramadol 50 mg #30 no refills provided. 3. Pap smear/HPV 2015 negative. No Pap smear done today.  History of LGSIL 2007 with cryosurgery. Normal Pap smears since then. 4. Mammography 04/2015. Continue with annual mammography when due. SBE monthly reviewed. 5. Insomnia. Patient asked for a prescription for Ambien. Usually gets from her primary physician. Uses once or twice monthly as needed. Ambien 10 mg #30 no refills provided. 6. Health maintenance. Baseline CBC, comprehensive metabolic panel, lipid profile, urinalysis, TSH, vitamin D ordered. Follow up for blood pressure recheck otherwise annual exam in one year.   Anastasio Auerbach MD, 8:24 AM 05/07/2015

## 2015-05-07 NOTE — Patient Instructions (Signed)
Office will contact you to help arrange the orthopedic appointment.  Heavier blood pressure rechecked over the next 1-2 months so that I can refill your birth control pills assuming that is normal.  You may obtain a copy of any labs that were done today by logging onto MyChart as outlined in the instructions provided with your AVS (after visit summary). The office will not call with normal lab results but certainly if there are any significant abnormalities then we will contact you.   Health Maintenance Adopting a healthy lifestyle and getting preventive care can go a long way to promote health and wellness. Talk with your health care provider about what schedule of regular examinations is right for you. This is a good chance for you to check in with your provider about disease prevention and staying healthy. In between checkups, there are plenty of things you can do on your own. Experts have done a lot of research about which lifestyle changes and preventive measures are most likely to keep you healthy. Ask your health care provider for more information. WEIGHT AND DIET  Eat a healthy diet  Be sure to include plenty of vegetables, fruits, low-fat dairy products, and lean protein.  Do not eat a lot of foods high in solid fats, added sugars, or salt.  Get regular exercise. This is one of the most important things you can do for your health.  Most adults should exercise for at least 150 minutes each week. The exercise should increase your heart rate and make you sweat (moderate-intensity exercise).  Most adults should also do strengthening exercises at least twice a week. This is in addition to the moderate-intensity exercise.  Maintain a healthy weight  Body mass index (BMI) is a measurement that can be used to identify possible weight problems. It estimates body fat based on height and weight. Your health care provider can help determine your BMI and help you achieve or maintain a healthy  weight.  For females 15 years of age and older:   A BMI below 18.5 is considered underweight.  A BMI of 18.5 to 24.9 is normal.  A BMI of 25 to 29.9 is considered overweight.  A BMI of 30 and above is considered obese.  Watch levels of cholesterol and blood lipids  You should start having your blood tested for lipids and cholesterol at 52 years of age, then have this test every 5 years.  You may need to have your cholesterol levels checked more often if:  Your lipid or cholesterol levels are high.  You are older than 52 years of age.  You are at high risk for heart disease.  CANCER SCREENING   Lung Cancer  Lung cancer screening is recommended for adults 58-110 years old who are at high risk for lung cancer because of a history of smoking.  A yearly low-dose CT scan of the lungs is recommended for people who:  Currently smoke.  Have quit within the past 15 years.  Have at least a 30-pack-year history of smoking. A pack year is smoking an average of one pack of cigarettes a day for 1 year.  Yearly screening should continue until it has been 15 years since you quit.  Yearly screening should stop if you develop a health problem that would prevent you from having lung cancer treatment.  Breast Cancer  Practice breast self-awareness. This means understanding how your breasts normally appear and feel.  It also means doing regular breast self-exams. Let your health  care provider know about any changes, no matter how small.  If you are in your 20s or 30s, you should have a clinical breast exam (CBE) by a health care provider every 1-3 years as part of a regular health exam.  If you are 57 or older, have a CBE every year. Also consider having a breast X-ray (mammogram) every year.  If you have a family history of breast cancer, talk to your health care provider about genetic screening.  If you are at high risk for breast cancer, talk to your health care provider about  having an MRI and a mammogram every year.  Breast cancer gene (BRCA) assessment is recommended for women who have family members with BRCA-related cancers. BRCA-related cancers include:  Breast.  Ovarian.  Tubal.  Peritoneal cancers.  Results of the assessment will determine the need for genetic counseling and BRCA1 and BRCA2 testing. Cervical Cancer Routine pelvic examinations to screen for cervical cancer are no longer recommended for nonpregnant women who are considered low risk for cancer of the pelvic organs (ovaries, uterus, and vagina) and who do not have symptoms. A pelvic examination may be necessary if you have symptoms including those associated with pelvic infections. Ask your health care provider if a screening pelvic exam is right for you.   The Pap test is the screening test for cervical cancer for women who are considered at risk.  If you had a hysterectomy for a problem that was not cancer or a condition that could lead to cancer, then you no longer need Pap tests.  If you are older than 65 years, and you have had normal Pap tests for the past 10 years, you no longer need to have Pap tests.  If you have had past treatment for cervical cancer or a condition that could lead to cancer, you need Pap tests and screening for cancer for at least 20 years after your treatment.  If you no longer get a Pap test, assess your risk factors if they change (such as having a new sexual partner). This can affect whether you should start being screened again.  Some women have medical problems that increase their chance of getting cervical cancer. If this is the case for you, your health care provider may recommend more frequent screening and Pap tests.  The human papillomavirus (HPV) test is another test that may be used for cervical cancer screening. The HPV test looks for the virus that can cause cell changes in the cervix. The cells collected during the Pap test can be tested for  HPV.  The HPV test can be used to screen women 33 years of age and older. Getting tested for HPV can extend the interval between normal Pap tests from three to five years.  An HPV test also should be used to screen women of any age who have unclear Pap test results.  After 52 years of age, women should have HPV testing as often as Pap tests.  Colorectal Cancer  This type of cancer can be detected and often prevented.  Routine colorectal cancer screening usually begins at 52 years of age and continues through 52 years of age.  Your health care provider may recommend screening at an earlier age if you have risk factors for colon cancer.  Your health care provider may also recommend using home test kits to check for hidden blood in the stool.  A small camera at the end of a tube can be used to examine your  colon directly (sigmoidoscopy or colonoscopy). This is done to check for the earliest forms of colorectal cancer.  Routine screening usually begins at age 78.  Direct examination of the colon should be repeated every 5-10 years through 52 years of age. However, you may need to be screened more often if early forms of precancerous polyps or small growths are found. Skin Cancer  Check your skin from head to toe regularly.  Tell your health care provider about any new moles or changes in moles, especially if there is a change in a mole's shape or color.  Also tell your health care provider if you have a mole that is larger than the size of a pencil eraser.  Always use sunscreen. Apply sunscreen liberally and repeatedly throughout the day.  Protect yourself by wearing long sleeves, pants, a wide-brimmed hat, and sunglasses whenever you are outside. HEART DISEASE, DIABETES, AND HIGH BLOOD PRESSURE   Have your blood pressure checked at least every 1-2 years. High blood pressure causes heart disease and increases the risk of stroke.  If you are between 61 years and 12 years old, ask  your health care provider if you should take aspirin to prevent strokes.  Have regular diabetes screenings. This involves taking a blood sample to check your fasting blood sugar level.  If you are at a normal weight and have a low risk for diabetes, have this test once every three years after 52 years of age.  If you are overweight and have a high risk for diabetes, consider being tested at a younger age or more often. PREVENTING INFECTION  Hepatitis B  If you have a higher risk for hepatitis B, you should be screened for this virus. You are considered at high risk for hepatitis B if:  You were born in a country where hepatitis B is common. Ask your health care provider which countries are considered high risk.  Your parents were born in a high-risk country, and you have not been immunized against hepatitis B (hepatitis B vaccine).  You have HIV or AIDS.  You use needles to inject street drugs.  You live with someone who has hepatitis B.  You have had sex with someone who has hepatitis B.  You get hemodialysis treatment.  You take certain medicines for conditions, including cancer, organ transplantation, and autoimmune conditions. Hepatitis C  Blood testing is recommended for:  Everyone born from [birth year] through 1963.  Anyone with known risk factors for hepatitis C. Sexually transmitted infections (STIs)  You should be screened for sexually transmitted infections (STIs) including gonorrhea and chlamydia if:  You are sexually active and are younger than 52 years of age.  You are older than 52 years of age and your health care provider tells you that you are at risk for this type of infection.  Your sexual activity has changed since you were last screened and you are at an increased risk for chlamydia or gonorrhea. Ask your health care provider if you are at risk.  If you do not have HIV, but are at risk, it may be recommended that you take a prescription medicine daily to  prevent HIV infection. This is called pre-exposure prophylaxis (PrEP). You are considered at risk if:  You are sexually active and do not regularly use condoms or know the HIV status of your partner(s).  You take drugs by injection.  You are sexually active with a partner who has HIV. Talk with your health care provider about whether you  are at high risk of being infected with HIV. If you choose to begin PrEP, you should first be tested for HIV. You should then be tested every 3 months for as long as you are taking PrEP.  PREGNANCY   If you are premenopausal and you may become pregnant, ask your health care provider about preconception counseling.  If you may become pregnant, take 400 to 800 micrograms (mcg) of folic acid every day.  If you want to prevent pregnancy, talk to your health care provider about birth control (contraception). OSTEOPOROSIS AND MENOPAUSE   Osteoporosis is a disease in which the bones lose minerals and strength with aging. This can result in serious bone fractures. Your risk for osteoporosis can be identified using a bone density scan.  If you are 28 years of age or older, or if you are at risk for osteoporosis and fractures, ask your health care provider if you should be screened.  Ask your health care provider whether you should take a calcium or vitamin D supplement to lower your risk for osteoporosis.  Menopause may have certain physical symptoms and risks.  Hormone replacement therapy may reduce some of these symptoms and risks. Talk to your health care provider about whether hormone replacement therapy is right for you.  HOME CARE INSTRUCTIONS   Schedule regular health, dental, and eye exams.  Stay current with your immunizations.   Do not use any tobacco products including cigarettes, chewing tobacco, or electronic cigarettes.  If you are pregnant, do not drink alcohol.  If you are breastfeeding, limit how much and how often you drink  alcohol.  Limit alcohol intake to no more than 1 drink per day for nonpregnant women. One drink equals 12 ounces of beer, 5 ounces of wine, or 1 ounces of hard liquor.  Do not use street drugs.  Do not share needles.  Ask your health care provider for help if you need support or information about quitting drugs.  Tell your health care provider if you often feel depressed.  Tell your health care provider if you have ever been abused or do not feel safe at home. Document Released: 12/07/2010 Document Revised: 10/08/2013 Document Reviewed: 04/25/2013 [facility] Patient Information 2015 [place], [place]. This information is not intended to replace advice given to you by your health care provider. Make sure you discuss any questions you have with your health care provider.

## 2015-05-08 ENCOUNTER — Other Ambulatory Visit: Payer: Self-pay | Admitting: Gynecology

## 2015-05-08 ENCOUNTER — Telehealth: Payer: Self-pay

## 2015-05-08 ENCOUNTER — Telehealth: Payer: Self-pay | Admitting: *Deleted

## 2015-05-08 LAB — VITAMIN D 25 HYDROXY (VIT D DEFICIENCY, FRACTURES): Vit D, 25-Hydroxy: 16 ng/mL — ABNORMAL LOW (ref 30–100)

## 2015-05-08 LAB — URINALYSIS W MICROSCOPIC + REFLEX CULTURE
Bacteria, UA: NONE SEEN [HPF]
Bilirubin Urine: NEGATIVE
CASTS: NONE SEEN [LPF]
Crystals: NONE SEEN [HPF]
GLUCOSE, UA: NEGATIVE
Ketones, ur: NEGATIVE
LEUKOCYTES UA: NEGATIVE
NITRITE: NEGATIVE
PH: 5.5 (ref 5.0–8.0)
PROTEIN: NEGATIVE
Specific Gravity, Urine: 1.026 (ref 1.001–1.035)
YEAST: NONE SEEN [HPF]

## 2015-05-08 NOTE — Telephone Encounter (Signed)
I called and left message on pt voicemail that she can schedule her appointment, no referral needed. Number left on voicemail to call [facility].

## 2015-05-08 NOTE — Telephone Encounter (Signed)
Patient said Dr. Loetta Rough told her yesterday he would call in Rx for Tramadol but it is not at pharmacy. Upon review it was sent to print and was not called in. I called it in to pharmacy. Patient informed.

## 2015-05-08 NOTE — Telephone Encounter (Signed)
-----   Message from Anastasio Auerbach, MD sent at 05/07/2015 10:58 AM EST ----- Help arrange orthopedic evaluation reference low back pain with sciatica. Did see [facility] in the past for other reasons. She is having pain now so would like appointment sooner than later. If cannot get into [facility] relatively quickly then any other orthopedic group okay.

## 2015-05-09 ENCOUNTER — Other Ambulatory Visit: Payer: Self-pay | Admitting: Gynecology

## 2015-05-09 DIAGNOSIS — E559 Vitamin D deficiency, unspecified: Secondary | ICD-10-CM

## 2015-05-09 DIAGNOSIS — E782 Mixed hyperlipidemia: Secondary | ICD-10-CM

## 2015-05-09 DIAGNOSIS — R3129 Other microscopic hematuria: Secondary | ICD-10-CM

## 2015-05-09 DIAGNOSIS — E781 Pure hyperglyceridemia: Secondary | ICD-10-CM

## 2015-05-09 LAB — URINE CULTURE
Colony Count: NO GROWTH
Organism ID, Bacteria: NO GROWTH

## 2015-05-15 ENCOUNTER — Telehealth: Payer: Self-pay | Admitting: *Deleted

## 2015-05-15 NOTE — Telephone Encounter (Signed)
Pt was seen for GYN exam on 05/07/15 states since visit she has had discomfort with anal fissure, with small amount of bleeding. Pt would like you recommendations on what she can use? Rx? Please advise

## 2015-05-15 NOTE — Telephone Encounter (Signed)
Would recommend starting with OTC hemorrhoidal product. If she is tried this and didn't work and we can go to the prescription strength [facility]

## 2015-05-15 NOTE — Telephone Encounter (Signed)
Pt informed with the below note, will try the OTC product and call if no relief.

## 2015-05-27 ENCOUNTER — Other Ambulatory Visit: Payer: BLUE CROSS/BLUE SHIELD

## 2015-05-27 DIAGNOSIS — R3129 Other microscopic hematuria: Secondary | ICD-10-CM

## 2015-05-27 DIAGNOSIS — E782 Mixed hyperlipidemia: Secondary | ICD-10-CM

## 2015-05-27 DIAGNOSIS — E559 Vitamin D deficiency, unspecified: Secondary | ICD-10-CM

## 2015-05-27 LAB — LIPID PANEL
Cholesterol: 189 mg/dL (ref 125–200)
HDL: 53 mg/dL (ref >=46)
LDL CALC: 109 mg/dL (ref <130)
TRIGLYCERIDES: 137 mg/dL (ref <150)
Total CHOL/HDL Ratio: 3.6 Ratio (ref <=5.0)
VLDL: 27 mg/dL (ref <30)

## 2015-05-28 LAB — URINALYSIS W MICROSCOPIC + REFLEX CULTURE
BACTERIA UA: NONE SEEN [HPF]
Bilirubin Urine: NEGATIVE
CASTS: NONE SEEN [LPF]
CRYSTALS: NONE SEEN [HPF]
Glucose, UA: NEGATIVE
Nitrite: NEGATIVE
PROTEIN: NEGATIVE
RBC / HPF: NONE SEEN RBC/HPF (ref <=2)
SPECIFIC GRAVITY, URINE: 1.019 (ref 1.001–1.035)
Yeast: NONE SEEN [HPF]
pH: 6 (ref 5.0–8.0)

## 2015-05-28 LAB — VITAMIN D 25 HYDROXY (VIT D DEFICIENCY, FRACTURES): VIT D 25 HYDROXY: 27 ng/mL — AB (ref 30–100)

## 2015-05-29 LAB — URINE CULTURE
Colony Count: NO GROWTH
Organism ID, Bacteria: NO GROWTH

## 2015-07-06 ENCOUNTER — Other Ambulatory Visit: Payer: Self-pay | Admitting: Gynecology

## 2015-07-07 ENCOUNTER — Other Ambulatory Visit: Payer: Self-pay | Admitting: Gynecology

## 2015-07-07 ENCOUNTER — Telehealth: Payer: Self-pay

## 2015-07-07 ENCOUNTER — Telehealth: Payer: Self-pay | Admitting: *Deleted

## 2015-07-07 NOTE — Telephone Encounter (Signed)
(  See Juliann Pulse note for today) pt will fax BP results from her job and I will give to Dr.Fontaine to review.

## 2015-07-07 NOTE — Telephone Encounter (Signed)
[facility] sent a request this morning for her Junel birth control pill. I refilled it as requested but upon closer look at note in chart Dr. Loetta Rough had only given her 2 mos worth as her BP had been elevated and he wanted her to have it rechecked and report. [facility] called [facility] and left message asking them to cancel the refill rx I sent over until we can get this issues with her BP resolved.

## 2015-07-08 ENCOUNTER — Telehealth: Payer: Self-pay | Admitting: *Deleted

## 2015-07-08 MED ORDER — NORETHIN ACE-ETH ESTRAD-FE 1.5-30 MG-MCG PO TABS: 1.0000 | ORAL_TABLET | Freq: Every day | ORAL | Status: DC

## 2015-07-08 NOTE — Telephone Encounter (Signed)
Pt was informed with all the below note, pt states she has heard a lot of bad things about Mirena IUD, states unable to come in with the next several weeks for placement due to work. Pt asked if you would be willing to fill her pill x 1 month until she can figure out her schedule., I explained to her I'm not sure you are willing to refill pills x 1 month, but I would ask.  She also mention she would like to know other options, I advise her to schedule OV with you to discuss. Please advise

## 2015-07-08 NOTE — Telephone Encounter (Signed)
Pt aware of the below, Rx sent. Abigail Butts will check benefits and relay to patient.

## 2015-07-08 NOTE — Telephone Encounter (Signed)
I would strongly recommend that she consider Mirena IUD. With the continued elevated blood pressure a little concerned about continuing the birth control pills. I would recommend if she is agreeable to schedule appointment but the Mirena IUD in any time of the next several weeks before her pills run out and also for her to make an appointment to follow up with her primary physician to continue to follow her blood pressure.

## 2015-07-08 NOTE — Telephone Encounter (Signed)
Okay to refill the pills 1 more month. There are several different options but progesterone only choices such as Depo-Provera or Nexplanon have side effects. Mirena IUD is not the same IUD that got all the bad publicity in the past.

## 2015-07-08 NOTE — Telephone Encounter (Signed)
Pt has about 2 weeks left of birth control pills was told to have blood pressure checked before refills, pt has it checked yesterday at her job,nurse faxed result it was 140/90. Please advise

## 2015-07-09 ENCOUNTER — Telehealth: Payer: Self-pay | Admitting: Gynecology

## 2015-07-09 NOTE — Telephone Encounter (Signed)
07/09/15-I spoke to pt to let her know that her [facility] will cover the Mirena for contraception at 100%, no copay. She will decide if she wants to proceed and let us know. I did remind her that insertion needs to be during her cycle.wl

## 2015-07-14 ENCOUNTER — Encounter: Payer: Self-pay | Admitting: Gynecology

## 2015-07-14 NOTE — Telephone Encounter (Signed)
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  It was 140/90.

## 2015-07-14 NOTE — Telephone Encounter (Signed)
Has she rechecked her blood pressure? That was the issue with the birth control pills. If her blood pressures were normal on monitoring then she can continue on the pills or consider Depo-Provera.  We need to document that her blood pressures were normal.

## 2015-07-15 NOTE — Telephone Encounter (Signed)
#  1 patient is to follow up with her primary physician for ongoing management of her hypertension #2 Depo-Provera would be okay to be administered with her menses though again I think the IUD would be a great choice

## 2015-09-01 ENCOUNTER — Other Ambulatory Visit: Payer: Self-pay | Admitting: *Deleted

## 2015-09-01 DIAGNOSIS — R7989 Other specified abnormal findings of blood chemistry: Secondary | ICD-10-CM

## 2016-03-23 ENCOUNTER — Ambulatory Visit (INDEPENDENT_AMBULATORY_CARE_PROVIDER_SITE_OTHER): Payer: BLUE CROSS/BLUE SHIELD | Admitting: Family Medicine

## 2016-03-23 VITALS — BP 120/100 | HR 99 | Temp 98.4°F | Ht 64.0 in | Wt 167.8 lb

## 2016-03-23 DIAGNOSIS — J069 Acute upper respiratory infection, unspecified: Secondary | ICD-10-CM | POA: Diagnosis not present

## 2016-03-23 DIAGNOSIS — F5102 Adjustment insomnia: Secondary | ICD-10-CM | POA: Diagnosis not present

## 2016-03-23 DIAGNOSIS — Z23 Encounter for immunization: Secondary | ICD-10-CM

## 2016-03-23 DIAGNOSIS — B9789 Other viral agents as the cause of diseases classified elsewhere: Secondary | ICD-10-CM

## 2016-03-23 MED ORDER — HYDROCOD POLST-CPM POLST ER 10-8 MG/5ML PO SUER: 5.0000 mL | Freq: Two times a day (BID) | ORAL | 0 refills | Status: DC | PRN

## 2016-03-23 MED ORDER — ZOLPIDEM TARTRATE 10 MG PO TABS: 10.0000 mg | ORAL_TABLET | Freq: Every evening | ORAL | 0 refills | Status: DC | PRN

## 2016-03-23 NOTE — Progress Notes (Signed)
Subjective:     Patient ID: Colleen Martinez, female   DOB: 1961/04/30, 55 y.o.   MRN: [id number]  HPI Patient seen with one-week history of sore throat. She had some coworkers with similar symptoms. She's also had some cough and postnasal drip symptoms. Mild body aches. No fevers or chills. No nausea or vomiting. Cough has been fairly severe at night. She has previously taken Tussionex which worked well. She states that helped her postnasal drip symptoms extremely well. She has upcoming travel to [place] and has taken Ambien in the past and requesting refills. She takes this very infrequently.  Past Medical History:  Diagnosis Date  . Cervical dysplasia 2007   LGSIL  . Chicken pox   . Heart murmur    functional  . Thyroid disease    Hypothyroidism   Past Surgical History:  Procedure Laterality Date  . AUGMENTATION MAMMAPLASTY    . BREAST SURGERY  2004   breast augmentation  . GYNECOLOGIC CRYOSURGERY  2007   LGSIL normal paps since  . liposuction  2004   stomach , bi-lat thighs    reports that she has never smoked. She has never used smokeless tobacco. She reports that she drinks about 0.5 oz of alcohol per week . She reports that she does not use drugs. family history includes Arthritis in her father and mother; Hyperlipidemia in her father and mother; Ovarian cancer in her mother. No Known Allergies   Review of Systems  Constitutional: Positive for fatigue.  HENT: Positive for congestion, postnasal drip and sore throat.   Respiratory: Positive for cough.   Gastrointestinal: Negative for nausea and vomiting.       Objective:   Physical Exam  Constitutional: She appears well-developed and well-nourished.  HENT:  Right Ear: External ear normal.  Left Ear: External ear normal.  Posterior pharynx erythema. No exudate  Neck: Neck supple.  Cardiovascular: Normal rate and regular rhythm.   Pulmonary/Chest: Effort normal and breath sounds normal. No respiratory distress. She  has no wheezes. She has no rales.  Lymphadenopathy:    She has no cervical adenopathy.       Assessment:     Probable viral URI with cough  Transient insomnia related to travel    Plan:     -Treat sore throat symptoms symptomatically -Limited Tussionex 120 mL 1 teaspoon daily at bedtime for severe cough -Ambien 10 mg daily at bedtime when necessary severe insomnia. She knows not to take this concomitant with Tussionex. This prescription was written for future travel international -Patient requesting tetanus booster as this was due in this is given  Eulas Post MD [facility]

## 2016-03-23 NOTE — Addendum Note (Signed)
Addended by: Elio Forget on: 03/23/2016 05:25 PM   Modules accepted: Orders

## 2016-03-23 NOTE — Progress Notes (Signed)
Pre visit review using our clinic review tool, if applicable. No additional management support is needed unless otherwise documented below in the visit note. 

## 2016-03-23 NOTE — Patient Instructions (Signed)

## 2016-04-12 DIAGNOSIS — M25511 Pain in right shoulder: Secondary | ICD-10-CM | POA: Diagnosis not present

## 2016-04-21 ENCOUNTER — Other Ambulatory Visit: Payer: Self-pay | Admitting: Women's Health

## 2016-04-21 DIAGNOSIS — Z1231 Encounter for screening mammogram for malignant neoplasm of breast: Secondary | ICD-10-CM

## 2016-05-18 ENCOUNTER — Encounter: Payer: Self-pay | Admitting: Women's Health

## 2016-05-18 ENCOUNTER — Ambulatory Visit (INDEPENDENT_AMBULATORY_CARE_PROVIDER_SITE_OTHER): Payer: BLUE CROSS/BLUE SHIELD | Admitting: Women's Health

## 2016-05-18 VITALS — BP 132/84 | Ht 64.0 in | Wt 164.0 lb

## 2016-05-18 DIAGNOSIS — Z79890 Hormone replacement therapy (postmenopausal): Secondary | ICD-10-CM

## 2016-05-18 DIAGNOSIS — Z01419 Encounter for gynecological examination (general) (routine) without abnormal findings: Secondary | ICD-10-CM

## 2016-05-18 DIAGNOSIS — Z13220 Encounter for screening for lipoid disorders: Secondary | ICD-10-CM

## 2016-05-18 LAB — CBC WITH DIFFERENTIAL/PLATELET
BASOS ABS: 42 {cells}/uL (ref 0–200)
Basophils Relative: 1 %
EOS ABS: 42 {cells}/uL (ref 15–500)
Eosinophils Relative: 1 %
HEMATOCRIT: 41.1 % (ref 35.0–45.0)
Hemoglobin: 13.9 g/dL (ref 11.7–15.5)
LYMPHS PCT: 37 %
Lymphs Abs: 1554 cells/uL (ref 850–3900)
MCH: 31.6 pg (ref 27.0–33.0)
MCHC: 33.8 g/dL (ref 32.0–36.0)
MCV: 93.4 fL (ref 80.0–100.0)
MONO ABS: 378 {cells}/uL (ref 200–950)
MPV: 10.3 fL (ref 7.5–12.5)
Monocytes Relative: 9 %
NEUTROS PCT: 52 %
Neutro Abs: 2184 cells/uL (ref 1500–7800)
Platelets: 200 10*3/uL (ref 140–400)
RBC: 4.4 MIL/uL (ref 3.80–5.10)
RDW: 13.2 % (ref 11.0–15.0)
WBC: 4.2 10*3/uL (ref 3.8–10.8)

## 2016-05-18 LAB — COMPREHENSIVE METABOLIC PANEL
ALT: 19 U/L (ref 6–29)
AST: 17 U/L (ref 10–35)
Albumin: 4.5 g/dL (ref 3.6–5.1)
Alkaline Phosphatase: 68 U/L (ref 33–130)
BUN: 17 mg/dL (ref 7–25)
CALCIUM: 9.7 mg/dL (ref 8.6–10.4)
CHLORIDE: 103 mmol/L (ref 98–110)
CO2: 30 mmol/L (ref 20–31)
Creat: 0.93 mg/dL (ref 0.50–1.05)
GLUCOSE: 96 mg/dL (ref 65–99)
POTASSIUM: 4.8 mmol/L (ref 3.5–5.3)
Sodium: 139 mmol/L (ref 135–146)
Total Bilirubin: 0.7 mg/dL (ref 0.2–1.2)
Total Protein: 6.8 g/dL (ref 6.1–8.1)

## 2016-05-18 LAB — LIPID PANEL
CHOL/HDL RATIO: 2.9 ratio (ref <5.0)
Cholesterol: 232 mg/dL — ABNORMAL HIGH (ref <200)
HDL: 79 mg/dL (ref >50)
LDL CALC: 125 mg/dL — AB (ref <100)
TRIGLYCERIDES: 142 mg/dL (ref <150)
VLDL: 28 mg/dL (ref <30)

## 2016-05-18 MED ORDER — ESTRADIOL 0.05 MG/24HR TD PTTW: 1.0000 | MEDICATED_PATCH | TRANSDERMAL | 4 refills | Status: DC

## 2016-05-18 MED ORDER — PROGESTERONE MICRONIZED 200 MG PO CAPS: 200.0000 mg | ORAL_CAPSULE | Freq: Every day | ORAL | 4 refills | Status: DC

## 2016-05-18 NOTE — Patient Instructions (Addendum)
Health Maintenance for Postmenopausal Women Introduction Menopause is a normal process in which your reproductive ability comes to an end. This process happens gradually over a span of months to years, usually between the ages of 20 and 50. Menopause is complete when you have missed 12 consecutive menstrual periods. It is important to talk with your health care provider about some of the most common conditions that affect postmenopausal women, such as heart disease, cancer, and bone loss (osteoporosis). Adopting a healthy lifestyle and getting preventive care can help to promote your health and wellness. Those actions can also lower your chances of developing some of these common conditions. What should I know about menopause? During menopause, you may experience a number of symptoms, such as:  Moderate-to-severe hot flashes.  Night sweats.  Decrease in sex drive.  Mood swings.  Headaches.  Tiredness.  Irritability.  Memory problems.  Insomnia. Choosing to treat or not to treat menopausal changes is an individual decision that you make with your health care provider. What should I know about hormone replacement therapy and supplements? Hormone therapy products are effective for treating symptoms that are associated with menopause, such as hot flashes and night sweats. Hormone replacement carries certain risks, especially as you become older. If you are thinking about using estrogen or estrogen with progestin treatments, discuss the benefits and risks with your health care provider. What should I know about heart disease and stroke? Heart disease, heart attack, and stroke become more likely as you age. This may be due, in part, to the hormonal changes that your body experiences during menopause. These can affect how your body processes dietary fats, triglycerides, and cholesterol. Heart attack and stroke are both medical emergencies. There are many things that you can do to help prevent  heart disease and stroke:  Have your blood pressure checked at least every 1-2 years. High blood pressure causes heart disease and increases the risk of stroke.  If you are 49-18 years old, ask your health care provider if you should take aspirin to prevent a heart attack or a stroke.  Do not use any tobacco products, including cigarettes, chewing tobacco, or electronic cigarettes. If you need help quitting, ask your health care provider.  It is important to eat a healthy diet and maintain a healthy weight.  Be sure to include plenty of vegetables, fruits, low-fat dairy products, and lean protein.  Avoid eating foods that are high in solid fats, added sugars, or salt (sodium).  Get regular exercise. This is one of the most important things that you can do for your health.  Try to exercise for at least 150 minutes each week. The type of exercise that you do should increase your heart rate and make you sweat. This is known as moderate-intensity exercise.  Try to do strengthening exercises at least twice each week. Do these in addition to the moderate-intensity exercise.  Know your numbers.Ask your health care provider to check your cholesterol and your blood glucose. Continue to have your blood tested as directed by your health care provider. What should I know about cancer screening? There are several types of cancer. Take the following steps to reduce your risk and to catch any cancer development as early as possible. Breast Cancer  Practice breast self-awareness.  This means understanding how your breasts normally appear and feel.  It also means doing regular breast self-exams. Let your health care provider know about any changes, no matter how small.  If you are 40 or  older, have a clinician do a breast exam (clinical breast exam or CBE) every year. Depending on your age, family history, and medical history, it may be recommended that you also have a yearly breast X-ray  (mammogram).  If you have a family history of breast cancer, talk with your health care provider about genetic screening.  If you are at high risk for breast cancer, talk with your health care provider about having an MRI and a mammogram every year.  Breast cancer (BRCA) gene test is recommended for women who have family members with BRCA-related cancers. Results of the assessment will determine the need for genetic counseling and BRCA1 and for BRCA2 testing. BRCA-related cancers include these types:  Breast. This occurs in males or females.  Ovarian.  Tubal. This may also be called fallopian tube cancer.  Cancer of the abdominal or pelvic lining (peritoneal cancer).  Prostate.  Pancreatic. Cervical, Uterine, and Ovarian Cancer  Your health care provider may recommend that you be screened regularly for cancer of the pelvic organs. These include your ovaries, uterus, and vagina. This screening involves a pelvic exam, which includes checking for microscopic changes to the surface of your cervix (Pap test).  For women ages 21-65, health care providers may recommend a pelvic exam and a Pap test every three years. For women ages 50-65, they may recommend the Pap test and pelvic exam, combined with testing for human papilloma virus (HPV), every five years. Some types of HPV increase your risk of cervical cancer. Testing for HPV may also be done on women of any age who have unclear Pap test results.  Other health care providers may not recommend any screening for nonpregnant women who are considered low risk for pelvic cancer and have no symptoms. Ask your health care provider if a screening pelvic exam is right for you.  If you have had past treatment for cervical cancer or a condition that could lead to cancer, you need Pap tests and screening for cancer for at least 20 years after your treatment. If Pap tests have been discontinued for you, your risk factors (such as having a new sexual  partner) need to be reassessed to determine if you should start having screenings again. Some women have medical problems that increase the chance of getting cervical cancer. In these cases, your health care provider may recommend that you have screening and Pap tests more often.  If you have a family history of uterine cancer or ovarian cancer, talk with your health care provider about genetic screening.  If you have vaginal bleeding after reaching menopause, tell your health care provider.  There are currently no reliable tests available to screen for ovarian cancer. Lung Cancer  Lung cancer screening is recommended for adults 59-37 years old who are at high risk for lung cancer because of a history of smoking. A yearly low-dose CT scan of the lungs is recommended if you:  Currently smoke.  Have a history of at least 30 pack-years of smoking and you currently smoke or have quit within the past 15 years. A pack-year is smoking an average of one pack of cigarettes per day for one year. Yearly screening should:  Continue until it has been 15 years since you quit.  Stop if you develop a health problem that would prevent you from having lung cancer treatment. Colorectal Cancer  This type of cancer can be detected and can often be prevented.  Routine colorectal cancer screening usually begins at age 9 and  continues through age 47.  If you have risk factors for colon cancer, your health care provider may recommend that you be screened at an earlier age.  If you have a family history of colorectal cancer, talk with your health care provider about genetic screening.  Your health care provider may also recommend using home test kits to check for hidden blood in your stool.  A small camera at the end of a tube can be used to examine your colon directly (sigmoidoscopy or colonoscopy). This is done to check for the earliest forms of colorectal cancer.  Direct examination of the colon should be  repeated every 5-10 years until age 23. However, if early forms of precancerous polyps or small growths are found or if you have a family history or genetic risk for colorectal cancer, you may need to be screened more often. Skin Cancer  Check your skin from head to toe regularly.  Monitor any moles. Be sure to tell your health care provider:  About any new moles or changes in moles, especially if there is a change in a mole's shape or color.  If you have a mole that is larger than the size of a pencil eraser.  If any of your family members has a history of skin cancer, especially at a Tayen Narang age, talk with your health care provider about genetic screening.  Always use sunscreen. Apply sunscreen liberally and repeatedly throughout the day.  Whenever you are outside, protect yourself by wearing long sleeves, pants, a wide-brimmed hat, and sunglasses. What should I know about osteoporosis? Osteoporosis is a condition in which bone destruction happens more quickly than new bone creation. After menopause, you may be at an increased risk for osteoporosis. To help prevent osteoporosis or the bone fractures that can happen because of osteoporosis, the following is recommended:  If you are 74-22 years old, get at least 1,000 mg of calcium and at least 600 mg of vitamin D per day.  If you are older than age 45 but younger than age 47, get at least 1,200 mg of calcium and at least 600 mg of vitamin D per day.  If you are older than age 13, get at least 1,200 mg of calcium and at least 800 mg of vitamin D per day. Smoking and excessive alcohol intake increase the risk of osteoporosis. Eat foods that are rich in calcium and vitamin D, and do weight-bearing exercises several times each week as directed by your health care provider. What should I know about how menopause affects my mental health? Depression may occur at any age, but it is more common as you become older. Common symptoms of depression  include:  Low or sad mood.  Changes in sleep patterns.  Changes in appetite or eating patterns.  Feeling an overall lack of motivation or enjoyment of activities that you previously enjoyed.  Frequent crying spells. Talk with your health care provider if you think that you are experiencing depression. What should I know about immunizations? It is important that you get and maintain your immunizations. These include:  Tetanus, diphtheria, and pertussis (Tdap) booster vaccine.  Influenza every year before the flu season begins.  Pneumonia vaccine.  Shingles vaccine. Your health care provider may also recommend other immunizations. This information is not intended to replace advice given to you by your health care provider. Make sure you discuss any questions you have with your health care provider.  [facility]  Dr Carlean Purl  [phone]  colonoscopy  Document Released: 07/16/2005 Document Revised: 12/12/2015 Document Reviewed: 02/25/2015  2017 Elsevier  Menopause and Hormone Replacement Therapy Introduction WHAT IS HORMONE REPLACEMENT THERAPY? Hormone replacement therapy (HRT) is the use of artificial (synthetic) hormones to replace hormones that your body stops producing during menopause. Menopause is the normal time of life when menstrual periods stop completely and the ovaries stop producing the female hormones estrogen and progesterone. This lack of hormones can affect your health and cause undesirable symptoms. HRT can relieve some of those symptoms. WHAT ARE MY OPTIONS FOR HRT? HRT may consist of the synthetic hormones estrogen and progestin, or it may consist of only estrogen (estrogen-only therapy). You and your health care provider will decide which form of HRT is best for you. If you choose to be on HRT and you have a uterus, estrogen and progestin are usually prescribed. Estrogen-only therapy is used for women who do not have a uterus. Possible options for taking HRT  include:  Pills.  Patches.  Gels.  Sprays.  Vaginal cream.  Vaginal rings.  Vaginal inserts. The amount of hormone(s) that you take and how long you take the hormone(s) varies depending on your individual health. It is important to:  Begin HRT with the lowest possible dosage.  Stop HRT as soon as your health care provider tells you to stop.  Work with your health care provider so that you feel informed and comfortable with your decisions. WHAT ARE THE BENEFITS OF HRT? HRT can reduce the frequency and severity of menopausal symptoms. Benefits of HRT vary depending on the menopausal symptoms that you have, the severity of your symptoms, and your overall health. HRT may help to improve the following menopausal symptoms:  Hot flashes and night sweats. These are sudden feelings of heat that spread over the face and body. The skin may turn red, like a blush. Night sweats are hot flashes that happen while you are sleeping or trying to sleep.  Bone loss (osteoporosis). The body loses calcium more quickly after menopause, causing the bones to become weaker. This can increase the risk for bone breaks (fractures).  Vaginal dryness. The lining of the vagina can become thin and dry, which can cause pain during sexual intercourse or cause infection, burning, or itching.  Urinary tract infections.  Urinary incontinence. This is a decreased ability to control when you urinate.  Irritability.  Short-term memory problems. WHAT ARE THE RISKS OF HRT? Risks of HRT vary depending on your individual health and medical history. Risks of HRT also depend on whether you receive both estrogen and progestin or you receive estrogen only.HRT may increase the risk of:  Spotting. This is when a small amount of bloodleaks from the vagina unexpectedly.  Endometrial cancer. This cancer is in the lining of the uterus (endometrium).  Breast cancer.  Increased density of breast tissue. This can make it  harder to find breast cancer on a breast X-ray (mammogram).  Stroke.  Heart attack.  Blood clots.  Gallbladder disease. Risks of HRT can increase if you have any of the following conditions:  Endometrial cancer.  Liver disease.  Heart disease.  Breast cancer.  History of blood clots.  History of stroke. HOW SHOULD I CARE FOR MYSELF WHILE I AM ON HRT?  Take over-the-counter and prescription medicines only as told by your health care provider.  Get mammograms, pelvic exams, and medical checkups as often as told by your health care provider.  Have Pap tests done as often as told by your health  care provider. A Pap test is sometimes called a Pap smear. It is a screening test that is used to check for signs of cancer of the cervix and vagina. A Pap test can also identify the presence of infection or precancerous changes. Pap tests may be done:  Every 3 years, starting at age 29.  Every 5 years, starting after age 33, in combination with testing for human papillomavirus (HPV).  More often or less often depending on other medical conditions you have, your age, and other risk factors.  It is your responsibility to get your Pap test results. Ask your health care provider or the department performing the test when your results will be ready.  Keep all follow-up visits as told by your health care provider. This is important. WHEN SHOULD I SEEK MEDICAL CARE? Talk with your health care provider if:  You have any of these:  Pain or swelling in your legs.  Shortness of breath.  Chest pain.  Lumps or changes in your breasts or armpits.  Slurred speech.  Pain, burning, or bleeding when you urine.  You develop any of these:  Unusual vaginal bleeding.  Dizziness or headaches.  Weakness or numbness in any part of your arms or legs.  Pain in your abdomen. This information is not intended to replace advice given to you by your health care provider. Make sure you discuss any  questions you have with your health care provider. Document Released: 02/20/2003 Document Revised: 10/30/2015 Document Reviewed: 11/25/2014  2017 Elsevier

## 2016-05-18 NOTE — Progress Notes (Signed)
Colleen Martinez March 31, 1961 [id number]    History:    Presents for annual exam.  Amenorrheic since February 2017 with numerous hot flashes causing poor sleep, dyspareunia. 2007 LGSIL with cryo-with normal Paps after. Normal mammogram history. Has not had a screening colonoscopy. Hypothyroidism managed by endocrinologist. Lost 10 pounds in the past year with diet and exercise.  Past medical history, past surgical history, family history and social history were all reviewed and documented in the EPIC chart. Works for [employer]. Parents hypertension and arthritis, mother died of ovarian cancer at 22.  ROS:  A ROS was performed and pertinent positives and negatives are included.  Exam:  Vitals:   05/18/16 0827  BP: 132/84  Weight: 164 lb (74.4 kg)  Height: 5\' 4"  (1.626 m)   Body mass index is 28.15 kg/m.   General appearance:  Normal Thyroid:  Symmetrical, normal in size, without palpable masses or nodularity. Respiratory  Auscultation:  Clear without wheezing or rhonchi Cardiovascular  Auscultation:  Regular rate, without rubs, murmurs or gallops  Edema/varicosities:  Not grossly evident Abdominal  Soft,nontender, without masses, guarding or rebound.  Liver/spleen:  No organomegaly noted  Hernia:  None appreciated  Skin  Inspection:  Grossly normal   Breasts: Examined lying and sitting, Augmented with a lift.     Right: Without masses, retractions, discharge or axillary adenopathy.     Left: Without masses, retractions, discharge or axillary adenopathy. Gentitourinary   Inguinal/mons:  Normal without inguinal adenopathy  External genitalia:  Normal  BUS/Urethra/Skene's glands:  Normal  Vagina:  Normal  Cervix:  Normal  Uterus:   normal in size, shape and contour.  Midline and mobile  Adnexa/parametria:     Rt: Without masses or tenderness.   Lt: Without masses or tenderness.  Anus and perineum: Normal  Digital rectal exam: Normal sphincter tone without palpated masses or  tenderness  Assessment/Plan:  55 y.o. MWF G3 P0 for annual exam with menopausal complaints.  Numerous menopausal symptoms 2007 LGSIL cryo-normal Paps after Hypothyroidism-endocrinologist manages Herniated disks-orthopedist  Plan: HRT reviewed, risks of blood clots, strokes, breast cancer. Will try Prometrium 200 mg day one through 12 of each month and at bedtime, Vivelle dot 0.05 patch twice weekly instructions reviewed, instructed to call if no relief of symptoms. SBE's, continue annual 3-D screening mammogram, calcium rich diet, continue exercise, vitamin D 2000 daily encouraged. Strongly encouraged colonoscopy, [facility] information given instructed to schedule. CBC, lipid panel, vitamin D, CMP, FSH, UA, Pap normal with negative HR HPV will repeat next year.    Huel Cote WHNP, 9:00 AM 05/18/2016

## 2016-05-19 LAB — VITAMIN D 25 HYDROXY (VIT D DEFICIENCY, FRACTURES): VIT D 25 HYDROXY: 27 ng/mL — AB (ref 30–100)

## 2016-05-19 LAB — FOLLICLE STIMULATING HORMONE: FSH: 80.8 m[IU]/mL

## 2016-05-20 ENCOUNTER — Other Ambulatory Visit: Payer: Self-pay | Admitting: Women's Health

## 2016-05-20 MED ORDER — VITAMIN D (ERGOCALCIFEROL) 1.25 MG (50000 UNIT) PO CAPS: 50000.0000 [IU] | ORAL_CAPSULE | ORAL | 0 refills | Status: DC

## 2016-05-28 ENCOUNTER — Ambulatory Visit: Payer: BLUE CROSS/BLUE SHIELD

## 2016-06-02 DIAGNOSIS — M5416 Radiculopathy, lumbar region: Secondary | ICD-10-CM | POA: Diagnosis not present

## 2016-06-16 ENCOUNTER — Encounter: Payer: Self-pay | Admitting: Women's Health

## 2016-06-16 ENCOUNTER — Ambulatory Visit
Admission: RE | Admit: 2016-06-16 | Discharge: 2016-06-16 | Disposition: A | Discharge status: Home / Self Care | Payer: BLUE CROSS/BLUE SHIELD | Source: Ambulatory Visit | Attending: Women's Health | Admitting: Women's Health

## 2016-06-16 DIAGNOSIS — Z1231 Encounter for screening mammogram for malignant neoplasm of breast: Secondary | ICD-10-CM

## 2016-07-01 DIAGNOSIS — M545 Low back pain: Secondary | ICD-10-CM | POA: Diagnosis not present

## 2016-07-01 DIAGNOSIS — M5416 Radiculopathy, lumbar region: Secondary | ICD-10-CM | POA: Diagnosis not present

## 2016-07-08 DIAGNOSIS — M545 Low back pain: Secondary | ICD-10-CM | POA: Diagnosis not present

## 2016-07-14 DIAGNOSIS — M5416 Radiculopathy, lumbar region: Secondary | ICD-10-CM | POA: Diagnosis not present

## 2016-07-14 DIAGNOSIS — M5136 Other intervertebral disc degeneration, lumbar region: Secondary | ICD-10-CM | POA: Diagnosis not present

## 2016-07-14 DIAGNOSIS — M545 Low back pain: Secondary | ICD-10-CM | POA: Diagnosis not present

## 2016-08-03 DIAGNOSIS — M545 Low back pain: Secondary | ICD-10-CM | POA: Diagnosis not present

## 2016-08-20 DIAGNOSIS — M5136 Other intervertebral disc degeneration, lumbar region: Secondary | ICD-10-CM | POA: Diagnosis not present

## 2016-08-20 DIAGNOSIS — M545 Low back pain: Secondary | ICD-10-CM | POA: Diagnosis not present

## 2016-08-20 DIAGNOSIS — M5416 Radiculopathy, lumbar region: Secondary | ICD-10-CM | POA: Diagnosis not present

## 2016-08-23 ENCOUNTER — Other Ambulatory Visit: Payer: Self-pay | Admitting: *Deleted

## 2016-08-23 DIAGNOSIS — Z79890 Hormone replacement therapy (postmenopausal): Secondary | ICD-10-CM

## 2016-08-23 MED ORDER — ESTRADIOL 0.05 MG/24HR TD PTTW: 1.0000 | MEDICATED_PATCH | TRANSDERMAL | 3 refills | Status: DC

## 2016-08-23 MED ORDER — PROGESTERONE MICRONIZED 200 MG PO CAPS: 200.0000 mg | ORAL_CAPSULE | Freq: Every day | ORAL | 3 refills | Status: DC

## 2016-11-17 DIAGNOSIS — M5136 Other intervertebral disc degeneration, lumbar region: Secondary | ICD-10-CM | POA: Diagnosis not present

## 2016-11-24 DIAGNOSIS — E039 Hypothyroidism, unspecified: Secondary | ICD-10-CM | POA: Diagnosis not present

## 2016-12-01 DIAGNOSIS — E039 Hypothyroidism, unspecified: Secondary | ICD-10-CM | POA: Diagnosis not present

## 2016-12-10 DIAGNOSIS — M5416 Radiculopathy, lumbar region: Secondary | ICD-10-CM | POA: Diagnosis not present

## 2017-01-25 DIAGNOSIS — H10021 Other mucopurulent conjunctivitis, right eye: Secondary | ICD-10-CM | POA: Diagnosis not present

## 2017-02-24 ENCOUNTER — Encounter: Payer: Self-pay | Admitting: Family Medicine

## 2017-05-12 ENCOUNTER — Other Ambulatory Visit: Payer: Self-pay | Admitting: Women's Health

## 2017-05-12 DIAGNOSIS — Z1231 Encounter for screening mammogram for malignant neoplasm of breast: Secondary | ICD-10-CM

## 2017-06-20 ENCOUNTER — Ambulatory Visit
Admission: RE | Admit: 2017-06-20 | Discharge: 2017-06-20 | Disposition: A | Discharge status: Home / Self Care | Payer: BLUE CROSS/BLUE SHIELD | Source: Ambulatory Visit | Attending: Women's Health | Admitting: Women's Health

## 2017-06-20 ENCOUNTER — Encounter: Payer: Self-pay | Admitting: Women's Health

## 2017-06-20 DIAGNOSIS — Z1231 Encounter for screening mammogram for malignant neoplasm of breast: Secondary | ICD-10-CM | POA: Diagnosis not present

## 2017-07-06 ENCOUNTER — Encounter: Payer: BLUE CROSS/BLUE SHIELD | Admitting: Women's Health

## 2017-07-18 DIAGNOSIS — H2513 Age-related nuclear cataract, bilateral: Secondary | ICD-10-CM | POA: Diagnosis not present

## 2017-07-19 ENCOUNTER — Telehealth: Payer: Self-pay | Admitting: *Deleted

## 2017-07-19 DIAGNOSIS — Z79890 Hormone replacement therapy (postmenopausal): Secondary | ICD-10-CM

## 2017-07-19 MED ORDER — PROGESTERONE MICRONIZED 200 MG PO CAPS: 200.0000 mg | ORAL_CAPSULE | Freq: Every day | ORAL | 0 refills | Status: DC

## 2017-07-19 NOTE — Telephone Encounter (Signed)
Pt informed with the below note. Rx sent for 12 tablet of progesterone, pt said she didn't have any.

## 2017-07-19 NOTE — Telephone Encounter (Signed)
Yes, you are correct she is to take the progesterone with the patch. However take the Prometrium 200 mg at bedtime for the next 12 days may get a withdrawal bleed after that. We will discuss at annual exam. May be best to put her on a combination pill at that time.

## 2017-07-19 NOTE — Telephone Encounter (Signed)
Pt called has annual scheduled on 08/02/17, was prescribed vivelle dot patch 0.05mg  patch and progesterone 200 mg day 1-12 monthly. Pt is using the patient, but she stopped the progesterone in June 2018, called today c/o spotting x 2 days when wiping only. I did explained that she was to take progesterone with estrogen, pt said she stopped progesterone because she kept bleeding while taking.  Do you recommend pt come in for visit now or okay to wait until annual exam?

## 2017-07-29 ENCOUNTER — Telehealth: Payer: Self-pay | Admitting: *Deleted

## 2017-07-29 NOTE — Telephone Encounter (Signed)
Pt called back and said bleeding did start when she started the progesterone on 07/19/17. Will follow up at annual visit on 08/02/17

## 2017-08-02 ENCOUNTER — Encounter: Payer: Self-pay | Admitting: Women's Health

## 2017-08-02 ENCOUNTER — Ambulatory Visit (INDEPENDENT_AMBULATORY_CARE_PROVIDER_SITE_OTHER): Payer: BLUE CROSS/BLUE SHIELD | Admitting: Women's Health

## 2017-08-02 VITALS — BP 130/80 | Ht 64.0 in | Wt 172.0 lb

## 2017-08-02 DIAGNOSIS — Z13220 Encounter for screening for lipoid disorders: Secondary | ICD-10-CM

## 2017-08-02 DIAGNOSIS — Z79890 Hormone replacement therapy (postmenopausal): Secondary | ICD-10-CM | POA: Diagnosis not present

## 2017-08-02 DIAGNOSIS — E559 Vitamin D deficiency, unspecified: Secondary | ICD-10-CM

## 2017-08-02 DIAGNOSIS — R87610 Atypical squamous cells of undetermined significance on cytologic smear of cervix (ASC-US): Secondary | ICD-10-CM | POA: Diagnosis not present

## 2017-08-02 DIAGNOSIS — Z01419 Encounter for gynecological examination (general) (routine) without abnormal findings: Secondary | ICD-10-CM | POA: Diagnosis not present

## 2017-08-02 MED ORDER — ZOLPIDEM TARTRATE 10 MG PO TABS: 10.0000 mg | ORAL_TABLET | Freq: Every evening | ORAL | 1 refills | Status: DC | PRN

## 2017-08-02 MED ORDER — ESTRADIOL-NORETHINDRONE ACET 0.5-0.1 MG PO TABS: 1.0000 | ORAL_TABLET | Freq: Every day | ORAL | 4 refills | Status: DC

## 2017-08-02 NOTE — Progress Notes (Signed)
Colleen Martinez 1960-07-27 [id number]    History:    Presents for annual exam. Postmenopausal on HRT Prometrium and Vivelle patch. Was having a monthly cycle after the Prometrium so did not use for several months, use last month and has had a heavy cycle for the last 2 weeks. 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 Mother died of ovarian cancer at age 55. Has not had a screening colonoscopy. Normal mammogram history. Hypothyroid, endocrinologist manages. Ambien for travel with work.  Past medical history, past surgical history, family history and social history were all reviewed and documented in the EPIC chart. Works at [employer]. Parents hypertension and arthritis.  ROS:  A ROS was performed and pertinent positives and negatives are included.  Exam:  Vitals:   08/02/17 0804  BP: 130/80  Weight: 172 lb (78 kg)  Height: 5\' 4"  (1.626 m)   Body mass index is 29.52 kg/m.   General appearance:  Normal Thyroid:  Symmetrical, normal in size, without palpable masses or nodularity. Respiratory  Auscultation:  Clear without wheezing or rhonchi Cardiovascular  Auscultation:  Regular rate, without rubs, murmurs or gallops  Edema/varicosities:  Not grossly evident Abdominal  Soft,nontender, without masses, guarding or rebound.  Liver/spleen:  No organomegaly noted  Hernia:  None appreciated  Skin  Inspection:  Grossly normal   Breasts: Examined lying and sitting.     Right: Without masses, retractions, discharge or axillary adenopathy.     Left: Without masses, retractions, discharge or axillary adenopathy. Gentitourinary   Inguinal/mons:  Normal without inguinal adenopathy  External genitalia:  Normal  BUS/Urethra/Skene's glands:  Normal  Vagina:  Normal scant menses  Cervix:  Normal stenotic  Uterus:  normal in size, shape and contour.  Midline and mobile  Adnexa/parametria:     Rt: Without masses or tenderness.   Lt: Without masses or tenderness.  Anus and  perineum: Normal  Digital rectal exam: Normal sphincter tone without palpated masses or tenderness  Assessment/Plan:  57 y.o. MWF G3 P0 for annual exam.    Postmenopausal on HRT Hypothyroid-endocrinologist manages Obesity 2007 LGSIL with cryo-normal Paps after  Plan: HRT options reviewed,  importance of progesterone for protection of uterus, Combination HRT reviewed will try Activella estradiol 0.5/progesterone 0.1 daily, instructed to call if continued problems with bleeding. SBE's, continue annual screening mammogram, calcium rich diet, vitamin D 2000 daily, increased exercise and decrease calorie/carbs encouraged. Screening colonoscopy reviewed and encouraged, [facility] information given instructed to schedule. Ambien 10 mg at bedtime when necessary, reports uses mostly with travel for work. Prescription and addictive properties reviewed.  CBC, CMP, lipid panel, vitamin D, Pap with HR HPV typing, new screening guidelines reviewed.    [place], 8:29 AM 08/02/2017

## 2017-08-02 NOTE — Patient Instructions (Signed)
Colonoscopy both you and BOB need one!!!  Dr Carlean Purl at [facility] Menopause is a normal process in which your reproductive ability comes to an end. This process happens gradually over a span of months to years, usually between the ages of 28 and 35. Menopause is complete when you have missed 12 consecutive menstrual periods. It is important to talk with your health care provider about some of the most common conditions that affect postmenopausal women, such as heart disease, cancer, and bone loss (osteoporosis). Adopting a healthy lifestyle and getting preventive care can help to promote your health and wellness. Those actions can also lower your chances of developing some of these common conditions. What should I know about menopause? During menopause, you may experience a number of symptoms, such as:  Moderate-to-severe hot flashes.  Night sweats.  Decrease in sex drive.  Mood swings.  Headaches.  Tiredness.  Irritability.  Memory problems.  Insomnia.  Choosing to treat or not to treat menopausal changes is an individual decision that you make with your health care provider. What should I know about hormone replacement therapy and supplements? Hormone therapy products are effective for treating symptoms that are associated with menopause, such as hot flashes and night sweats. Hormone replacement carries certain risks, especially as you become older. If you are thinking about using estrogen or estrogen with progestin treatments, discuss the benefits and risks with your health care provider. What should I know about heart disease and stroke? Heart disease, heart attack, and stroke become more likely as you age. This may be due, in part, to the hormonal changes that your body experiences during menopause. These can affect how your body processes dietary fats, triglycerides, and cholesterol. Heart attack and stroke are both medical  emergencies. There are many things that you can do to help prevent heart disease and stroke:  Have your blood pressure checked at least every 1-2 years. High blood pressure causes heart disease and increases the risk of stroke.  If you are 33-86 years old, ask your health care provider if you should take aspirin to prevent a heart attack or a stroke.  Do not use any tobacco products, including cigarettes, chewing tobacco, or electronic cigarettes. If you need help quitting, ask your health care provider.  It is important to eat a healthy diet and maintain a healthy weight. ? Be sure to include plenty of vegetables, fruits, low-fat dairy products, and lean protein. ? Avoid eating foods that are high in solid fats, added sugars, or salt (sodium).  Get regular exercise. This is one of the most important things that you can do for your health. ? Try to exercise for at least 150 minutes each week. The type of exercise that you do should increase your heart rate and make you sweat. This is known as moderate-intensity exercise. ? Try to do strengthening exercises at least twice each week. Do these in addition to the moderate-intensity exercise.  Know your numbers.Ask your health care provider to check your cholesterol and your blood glucose. Continue to have your blood tested as directed by your health care provider.  What should I know about cancer screening? There are several types of cancer. Take the following steps to reduce your risk and to catch any cancer development as early as possible. Breast Cancer  Practice breast self-awareness. ? This means understanding how your breasts normally appear and feel. ? It also means doing regular breast self-exams. Let your  health care provider know about any changes, no matter how small.  If you are 69 or older, have a clinician do a breast exam (clinical breast exam or CBE) every year. Depending on your age, family history, and medical history, it  may be recommended that you also have a yearly breast X-ray (mammogram).  If you have a family history of breast cancer, talk with your health care provider about genetic screening.  If you are at high risk for breast cancer, talk with your health care provider about having an MRI and a mammogram every year.  Breast cancer (BRCA) gene test is recommended for women who have family members with BRCA-related cancers. Results of the assessment will determine the need for genetic counseling and BRCA1 and for BRCA2 testing. BRCA-related cancers include these types: ? Breast. This occurs in males or females. ? Ovarian. ? Tubal. This may also be called fallopian tube cancer. ? Cancer of the abdominal or pelvic lining (peritoneal cancer). ? Prostate. ? Pancreatic.  Cervical, Uterine, and Ovarian Cancer Your health care provider may recommend that you be screened regularly for cancer of the pelvic organs. These include your ovaries, uterus, and vagina. This screening involves a pelvic exam, which includes checking for microscopic changes to the surface of your cervix (Pap test).  For women ages 21-65, health care providers may recommend a pelvic exam and a Pap test every three years. For women ages 35-65, they may recommend the Pap test and pelvic exam, combined with testing for human papilloma virus (HPV), every five years. Some types of HPV increase your risk of cervical cancer. Testing for HPV may also be done on women of any age who have unclear Pap test results.  Other health care providers may not recommend any screening for nonpregnant women who are considered low risk for pelvic cancer and have no symptoms. Ask your health care provider if a screening pelvic exam is right for you.  If you have had past treatment for cervical cancer or a condition that could lead to cancer, you need Pap tests and screening for cancer for at least 20 years after your treatment. If Pap tests have been discontinued  for you, your risk factors (such as having a new sexual partner) need to be reassessed to determine if you should start having screenings again. Some women have medical problems that increase the chance of getting cervical cancer. In these cases, your health care provider may recommend that you have screening and Pap tests more often.  If you have a family history of uterine cancer or ovarian cancer, talk with your health care provider about genetic screening.  If you have vaginal bleeding after reaching menopause, tell your health care provider.  There are currently no reliable tests available to screen for ovarian cancer.  Lung Cancer Lung cancer screening is recommended for adults 27-44 years old who are at high risk for lung cancer because of a history of smoking. A yearly low-dose CT scan of the lungs is recommended if you:  Currently smoke.  Have a history of at least 30 pack-years of smoking and you currently smoke or have quit within the past 15 years. A pack-year is smoking an average of one pack of cigarettes per day for one year.  Yearly screening should:  Continue until it has been 15 years since you quit.  Stop if you develop a health problem that would prevent you from having lung cancer treatment.  Colorectal Cancer  This type of cancer  can be detected and can often be prevented.  Routine colorectal cancer screening usually begins at age 43 and continues through age 19.  If you have risk factors for colon cancer, your health care provider may recommend that you be screened at an earlier age.  If you have a family history of colorectal cancer, talk with your health care provider about genetic screening.  Your health care provider may also recommend using home test kits to check for hidden blood in your stool.  A small camera at the end of a tube can be used to examine your colon directly (sigmoidoscopy or colonoscopy). This is done to check for the earliest forms of  colorectal cancer.  Direct examination of the colon should be repeated every 5-10 years until age 59. However, if early forms of precancerous polyps or small growths are found or if you have a family history or genetic risk for colorectal cancer, you may need to be screened more often.  Skin Cancer  Check your skin from head to toe regularly.  Monitor any moles. Be sure to tell your health care provider: ? About any new moles or changes in moles, especially if there is a change in a mole's shape or color. ? If you have a mole that is larger than the size of a pencil eraser.  If any of your family members has a history of skin cancer, especially at a Adream Parzych age, talk with your health care provider about genetic screening.  Always use sunscreen. Apply sunscreen liberally and repeatedly throughout the day.  Whenever you are outside, protect yourself by wearing long sleeves, pants, a wide-brimmed hat, and sunglasses.  What should I know about osteoporosis? Osteoporosis is a condition in which bone destruction happens more quickly than new bone creation. After menopause, you may be at an increased risk for osteoporosis. To help prevent osteoporosis or the bone fractures that can happen because of osteoporosis, the following is recommended:  If you are 74-51 years old, get at least 1,000 mg of calcium and at least 600 mg of vitamin D per day.  If you are older than age 59 but younger than age 31, get at least 1,200 mg of calcium and at least 600 mg of vitamin D per day.  If you are older than age 76, get at least 1,200 mg of calcium and at least 800 mg of vitamin D per day.  Smoking and excessive alcohol intake increase the risk of osteoporosis. Eat foods that are rich in calcium and vitamin D, and do weight-bearing exercises several times each week as directed by your health care provider. What should I know about how menopause affects my mental health? Depression may occur at any age, but it  is more common as you become older. Common symptoms of depression include:  Low or sad mood.  Changes in sleep patterns.  Changes in appetite or eating patterns.  Feeling an overall lack of motivation or enjoyment of activities that you previously enjoyed.  Frequent crying spells.  Talk with your health care provider if you think that you are experiencing depression. What should I know about immunizations? It is important that you get and maintain your immunizations. These include:  Tetanus, diphtheria, and pertussis (Tdap) booster vaccine.  Influenza every year before the flu season begins.  Pneumonia vaccine.  Shingles vaccine.  Your health care provider may also recommend other immunizations. This information is not intended to replace advice given to you by your health care provider.  Make sure you discuss any questions you have with your health care provider. Document Released: 07/16/2005 Document Revised: 12/12/2015 Document Reviewed: 02/25/2015 Elsevier Interactive Patient Education  2018 [employer].

## 2017-08-03 LAB — CBC WITH DIFFERENTIAL/PLATELET
Basophils Absolute: 40 cells/uL (ref 0–200)
Basophils Relative: 1 %
EOS PCT: 2 %
Eosinophils Absolute: 80 cells/uL (ref 15–500)
HCT: 40.6 % (ref 35.0–45.0)
HEMOGLOBIN: 13.7 g/dL (ref 11.7–15.5)
Lymphs Abs: 1172 cells/uL (ref 850–3900)
MCH: 31.5 pg (ref 27.0–33.0)
MCHC: 33.7 g/dL (ref 32.0–36.0)
MCV: 93.3 fL (ref 80.0–100.0)
MONOS PCT: 8.8 %
MPV: 11.1 fL (ref 7.5–12.5)
NEUTROS ABS: 2356 {cells}/uL (ref 1500–7800)
Neutrophils Relative %: 58.9 %
Platelets: 181 10*3/uL (ref 140–400)
RBC: 4.35 10*6/uL (ref 3.80–5.10)
RDW: 12.1 % (ref 11.0–15.0)
Total Lymphocyte: 29.3 %
WBC mixed population: 352 cells/uL (ref 200–950)
WBC: 4 10*3/uL (ref 3.8–10.8)

## 2017-08-03 LAB — COMPREHENSIVE METABOLIC PANEL
AG RATIO: 2.1 (calc) (ref 1.0–2.5)
ALT: 13 U/L (ref 6–29)
AST: 15 U/L (ref 10–35)
Albumin: 4.6 g/dL (ref 3.6–5.1)
Alkaline phosphatase (APISO): 52 U/L (ref 33–130)
BUN: 18 mg/dL (ref 7–25)
CO2: 27 mmol/L (ref 20–32)
CREATININE: 0.87 mg/dL (ref 0.50–1.05)
Calcium: 9.2 mg/dL (ref 8.6–10.4)
Chloride: 104 mmol/L (ref 98–110)
GLOBULIN: 2.2 g/dL (ref 1.9–3.7)
GLUCOSE: 96 mg/dL (ref 65–99)
Potassium: 4.8 mmol/L (ref 3.5–5.3)
SODIUM: 138 mmol/L (ref 135–146)
TOTAL PROTEIN: 6.8 g/dL (ref 6.1–8.1)
Total Bilirubin: 0.6 mg/dL (ref 0.2–1.2)

## 2017-08-03 LAB — LIPID PANEL
CHOL/HDL RATIO: 3.5 (calc) (ref <5.0)
Cholesterol: 253 mg/dL — ABNORMAL HIGH (ref <200)
HDL: 73 mg/dL (ref >50)
LDL Cholesterol (Calc): 157 mg/dL (calc) — ABNORMAL HIGH
NON-HDL CHOLESTEROL (CALC): 180 mg/dL — AB (ref <130)
TRIGLYCERIDES: 109 mg/dL (ref <150)

## 2017-08-03 LAB — VITAMIN D 25 HYDROXY (VIT D DEFICIENCY, FRACTURES): VIT D 25 HYDROXY: 29 ng/mL — AB (ref 30–100)

## 2017-08-08 ENCOUNTER — Encounter: Payer: Self-pay | Admitting: Women's Health

## 2017-08-08 LAB — PAP, TP IMAGING W/ HPV RNA, RFLX HPV TYPE 16,18/45: HPV DNA HIGH RISK: DETECTED — AB

## 2017-08-10 DIAGNOSIS — Z713 Dietary counseling and surveillance: Secondary | ICD-10-CM | POA: Diagnosis not present

## 2017-08-17 ENCOUNTER — Encounter: Payer: Self-pay | Admitting: Gynecology

## 2017-08-17 ENCOUNTER — Ambulatory Visit: Payer: BLUE CROSS/BLUE SHIELD | Admitting: Gynecology

## 2017-08-17 VITALS — BP 122/76

## 2017-08-17 DIAGNOSIS — R87610 Atypical squamous cells of undetermined significance on cytologic smear of cervix (ASC-US): Secondary | ICD-10-CM

## 2017-08-17 DIAGNOSIS — R87810 Cervical high risk human papillomavirus (HPV) DNA test positive: Secondary | ICD-10-CM

## 2017-08-17 NOTE — Patient Instructions (Signed)
Office will call you with biopsy results 

## 2017-08-17 NOTE — Progress Notes (Signed)
    Colleen Martinez 05-02-1961 [id number]        57 y.o.  G3P0030 presents for colposcopy.  Most recent Pap smear showed ASCUS with positive high risk HPV.  Patient last Pap smear 2015 was normal with negative HPV.  History of LGSIL 2007 with cryosurgery.  Normal Pap smears since until her last Pap smear.  Past medical history,surgical history, problem list, medications, allergies, family history and social history were all reviewed and documented in the EPIC chart.  Directed ROS with pertinent positives and negatives documented in the history of present illness/assessment and plan.  Exam: Colleen Martinez assistant Vitals:   08/17/17 1105  BP: 122/76   General appearance:  Normal Abdomen soft nontender without masses guarding rebound Pelvic external BUS vagina normal.  Cervix normal with atrophic changes.  Uterus normal size midline mobile nontender.  Adnexa without masses or tenderness.  Colposcopy performed after acetic acid cleanse was inadequate with no transformation zone seen.  No colposcopic abnormalities on the ectocervix noted.  Cervical loss is stenotic.  The cervix was dilated to allow for an ECC which was performed without difficulty.  Patient tolerated well. Physical Exam  Genitourinary:        Assessment/Plan:  57 y.o. G3P0030 with ASCUS positive high risk HPV Pap smear.  History of LGSIL in the past treated with cryosurgery.  Patient and I had an extensive discussion about dysplasia, high-grade/low-grade, progression/regression.  We discussed the HPV issue and whether this is a new exposure versus continuance of an old exposure given her LGSIL in the past.  She has discussed with her husband who denies new exposure.  She has had a negative screen in the interim from her LGSIL but possible sampling error.  Patient will follow-up for the ECC results.  Assuming negative or minimal atypia then plan follow-up Pap smear/HPV in 1 year.  If otherwise then will triaged based upon  results.    Anastasio Auerbach MD, 11:37 AM 08/17/2017

## 2017-08-17 NOTE — Addendum Note (Signed)
Addended by: Anastasio Auerbach on: 08/17/2017 12:03 PM   Modules accepted: Orders

## 2017-08-19 LAB — PATHOLOGY

## 2017-08-19 LAB — TISSUE SPECIMEN

## 2017-09-28 DIAGNOSIS — Z713 Dietary counseling and surveillance: Secondary | ICD-10-CM | POA: Diagnosis not present

## 2017-11-22 DIAGNOSIS — M25522 Pain in left elbow: Secondary | ICD-10-CM | POA: Diagnosis not present

## 2017-11-22 DIAGNOSIS — M7712 Lateral epicondylitis, left elbow: Secondary | ICD-10-CM | POA: Diagnosis not present

## 2017-11-24 DIAGNOSIS — E039 Hypothyroidism, unspecified: Secondary | ICD-10-CM | POA: Diagnosis not present

## 2017-12-01 DIAGNOSIS — E039 Hypothyroidism, unspecified: Secondary | ICD-10-CM | POA: Diagnosis not present

## 2018-01-18 DIAGNOSIS — Z713 Dietary counseling and surveillance: Secondary | ICD-10-CM | POA: Diagnosis not present

## 2018-02-21 ENCOUNTER — Ambulatory Visit: Payer: BLUE CROSS/BLUE SHIELD | Admitting: Gynecology

## 2018-03-03 DIAGNOSIS — E039 Hypothyroidism, unspecified: Secondary | ICD-10-CM | POA: Diagnosis not present

## 2018-04-06 ENCOUNTER — Encounter: Payer: Self-pay | Admitting: Gynecology

## 2018-04-06 ENCOUNTER — Ambulatory Visit: Payer: BLUE CROSS/BLUE SHIELD | Admitting: Gynecology

## 2018-04-06 VITALS — BP 120/74

## 2018-04-06 DIAGNOSIS — Z01419 Encounter for gynecological examination (general) (routine) without abnormal findings: Secondary | ICD-10-CM

## 2018-04-06 DIAGNOSIS — R87610 Atypical squamous cells of undetermined significance on cytologic smear of cervix (ASC-US): Secondary | ICD-10-CM | POA: Diagnosis not present

## 2018-04-06 DIAGNOSIS — R87612 Low grade squamous intraepithelial lesion on cytologic smear of cervix (LGSIL): Secondary | ICD-10-CM

## 2018-04-06 DIAGNOSIS — Z79890 Hormone replacement therapy (postmenopausal): Secondary | ICD-10-CM | POA: Diagnosis not present

## 2018-04-06 MED ORDER — ESTRADIOL-NORETHINDRONE ACET 0.5-0.1 MG PO TABS: 1.0000 | ORAL_TABLET | Freq: Every day | ORAL | 1 refills | Status: DC

## 2018-04-06 MED ORDER — ZOLPIDEM TARTRATE 10 MG PO TABS: 10.0000 mg | ORAL_TABLET | Freq: Every evening | ORAL | 1 refills | Status: DC | PRN

## 2018-04-06 NOTE — Addendum Note (Signed)
Addended by: Nelva Nay on: 04/06/2018 08:31 AM   Modules accepted: Orders

## 2018-04-06 NOTE — Patient Instructions (Signed)
Follow-up for Pap smear results. 

## 2018-04-06 NOTE — Progress Notes (Signed)
    Colleen Martinez [birth year] [id number]        57 y.o.  G3P0030 presents for follow-up Pap smear.  History of cryosurgery for LGSIL 2007.  Pap smear 2015 normal with negative HPV.  Pap smear early 2019 showed ASCUS positive high risk HPV.  Follow-up colposcopy was inadequate with no abnormality seen.  ECC showed LGSIL.  Presents for six-month follow-up Pap smear.  Past medical history,surgical history, problem list, medications, allergies, family history and social history were all reviewed and documented in the EPIC chart.  Directed ROS with pertinent positives and negatives documented in the history of present illness/assessment and plan.  Exam: Caryn Bee assistant Vitals:   04/06/18 0759  BP: 120/74   General appearance:  Normal Abdomen soft nontender without masses guarding rebound Pelvic external BUS vagina with atrophic changes.  Cervix with atrophic changes.  Uterus normal size midline mobile nontender.  Adnexa without masses or tenderness.  Assessment/Plan:  57 y.o. G3P0030 with history of low-grade dysplasia and positive high risk HPV.  Pap smear done.  We again discussed the whole issue of HPV and dysplasia, low-grade versus high-grade.  If Pap smear is normal/low-grade plan expectant management.  If otherwise then will triaged based upon results.  Patient asked for a refill of her Activella and Ambien to get her through her annual exam appointment in March.  Activella No. 90 with 1 refill and Ambien No. 30 with 1 refill provided.    Anastasio Auerbach MD, 8:09 AM 04/06/2018

## 2018-04-10 LAB — PAP IG W/ RFLX HPV ASCU

## 2018-04-10 LAB — HUMAN PAPILLOMAVIRUS, HIGH RISK: HPV DNA HIGH RISK: DETECTED — AB

## 2018-06-08 ENCOUNTER — Other Ambulatory Visit: Payer: Self-pay | Admitting: Gynecology

## 2018-06-08 ENCOUNTER — Other Ambulatory Visit: Payer: Self-pay | Admitting: Women's Health

## 2018-06-08 DIAGNOSIS — Z1231 Encounter for screening mammogram for malignant neoplasm of breast: Secondary | ICD-10-CM

## 2018-06-19 DIAGNOSIS — H2513 Age-related nuclear cataract, bilateral: Secondary | ICD-10-CM | POA: Diagnosis not present

## 2018-07-06 ENCOUNTER — Ambulatory Visit
Admission: RE | Admit: 2018-07-06 | Discharge: 2018-07-06 | Disposition: A | Discharge status: Home / Self Care | Payer: BLUE CROSS/BLUE SHIELD | Source: Ambulatory Visit | Attending: Women's Health | Admitting: Women's Health

## 2018-07-06 DIAGNOSIS — Z1231 Encounter for screening mammogram for malignant neoplasm of breast: Secondary | ICD-10-CM

## 2018-08-08 ENCOUNTER — Encounter: Payer: BLUE CROSS/BLUE SHIELD | Admitting: Women's Health

## 2018-08-08 DIAGNOSIS — M25522 Pain in left elbow: Secondary | ICD-10-CM | POA: Diagnosis not present

## 2018-08-08 DIAGNOSIS — M7712 Lateral epicondylitis, left elbow: Secondary | ICD-10-CM | POA: Diagnosis not present

## 2018-08-16 ENCOUNTER — Encounter: Payer: BLUE CROSS/BLUE SHIELD | Admitting: Women's Health

## 2018-09-11 ENCOUNTER — Other Ambulatory Visit: Payer: Self-pay

## 2018-09-13 ENCOUNTER — Encounter: Payer: Self-pay | Admitting: Women's Health

## 2018-09-13 ENCOUNTER — Other Ambulatory Visit: Payer: Self-pay

## 2018-09-13 ENCOUNTER — Ambulatory Visit (INDEPENDENT_AMBULATORY_CARE_PROVIDER_SITE_OTHER): Payer: BLUE CROSS/BLUE SHIELD | Admitting: Women's Health

## 2018-09-13 VITALS — BP 120/78 | Ht 64.0 in | Wt 173.0 lb

## 2018-09-13 DIAGNOSIS — R87610 Atypical squamous cells of undetermined significance on cytologic smear of cervix (ASC-US): Secondary | ICD-10-CM | POA: Diagnosis not present

## 2018-09-13 DIAGNOSIS — Z13220 Encounter for screening for lipoid disorders: Secondary | ICD-10-CM | POA: Diagnosis not present

## 2018-09-13 DIAGNOSIS — Z79890 Hormone replacement therapy (postmenopausal): Secondary | ICD-10-CM | POA: Diagnosis not present

## 2018-09-13 DIAGNOSIS — Z01419 Encounter for gynecological examination (general) (routine) without abnormal findings: Secondary | ICD-10-CM | POA: Diagnosis not present

## 2018-09-13 DIAGNOSIS — G4700 Insomnia, unspecified: Secondary | ICD-10-CM | POA: Insufficient documentation

## 2018-09-13 DIAGNOSIS — F5101 Primary insomnia: Secondary | ICD-10-CM | POA: Diagnosis not present

## 2018-09-13 LAB — LIPID PANEL
Cholesterol: 233 mg/dL — ABNORMAL HIGH (ref <200)
HDL: 67 mg/dL (ref >=50)
LDL Cholesterol (Calc): 139 mg/dL (calc) — ABNORMAL HIGH
Non-HDL Cholesterol (Calc): 166 mg/dL (calc) — ABNORMAL HIGH (ref <130)
Total CHOL/HDL Ratio: 3.5 (calc) (ref <5.0)
Triglycerides: 144 mg/dL (ref <150)

## 2018-09-13 LAB — CBC WITH DIFFERENTIAL/PLATELET
Absolute Monocytes: 361 cells/uL (ref 200–950)
Basophils Absolute: 32 cells/uL (ref 0–200)
Basophils Relative: 0.9 %
Eosinophils Absolute: 70 cells/uL (ref 15–500)
Eosinophils Relative: 2 %
HCT: 39.4 % (ref 35.0–45.0)
Hemoglobin: 13.5 g/dL (ref 11.7–15.5)
Lymphs Abs: 1110 cells/uL (ref 850–3900)
MCH: 31.9 pg (ref 27.0–33.0)
MCHC: 34.3 g/dL (ref 32.0–36.0)
MCV: 93.1 fL (ref 80.0–100.0)
MPV: 11 fL (ref 7.5–12.5)
Monocytes Relative: 10.3 %
Neutro Abs: 1929 cells/uL (ref 1500–7800)
Neutrophils Relative %: 55.1 %
Platelets: 158 10*3/uL (ref 140–400)
RBC: 4.23 10*6/uL (ref 3.80–5.10)
RDW: 11.9 % (ref 11.0–15.0)
Total Lymphocyte: 31.7 %
WBC: 3.5 10*3/uL — ABNORMAL LOW (ref 3.8–10.8)

## 2018-09-13 LAB — COMPREHENSIVE METABOLIC PANEL
AG Ratio: 2 (calc) (ref 1.0–2.5)
ALT: 11 U/L (ref 6–29)
AST: 11 U/L (ref 10–35)
Albumin: 4.3 g/dL (ref 3.6–5.1)
Alkaline phosphatase (APISO): 43 U/L (ref 37–153)
BUN: 17 mg/dL (ref 7–25)
CO2: 26 mmol/L (ref 20–32)
Calcium: 9 mg/dL (ref 8.6–10.4)
Chloride: 105 mmol/L (ref 98–110)
Creat: 0.93 mg/dL (ref 0.50–1.05)
Globulin: 2.1 g/dL (calc) (ref 1.9–3.7)
Glucose, Bld: 97 mg/dL (ref 65–99)
Potassium: 4.1 mmol/L (ref 3.5–5.3)
Sodium: 138 mmol/L (ref 135–146)
Total Bilirubin: 0.7 mg/dL (ref 0.2–1.2)
Total Protein: 6.4 g/dL (ref 6.1–8.1)

## 2018-09-13 MED ORDER — ESTRADIOL-NORETHINDRONE ACET 0.5-0.1 MG PO TABS: 1.0000 | ORAL_TABLET | Freq: Every day | ORAL | 4 refills | Status: DC

## 2018-09-13 MED ORDER — ALPRAZOLAM 0.25 MG PO TABS: 0.2500 mg | ORAL_TABLET | Freq: Every evening | ORAL | 1 refills | Status: DC | PRN

## 2018-09-13 MED ORDER — ZOLPIDEM TARTRATE 10 MG PO TABS: 10.0000 mg | ORAL_TABLET | Freq: Every evening | ORAL | 1 refills | Status: DC | PRN

## 2018-09-13 NOTE — Patient Instructions (Signed)
Colonoscopy [phone]   Dr Carlean Purl at [facility] Women Menopause is a normal process in which your reproductive ability comes to an end. This process happens gradually over a span of months to years, usually between the ages of 46 and 57. Menopause is complete when you have missed 12 consecutive menstrual periods. It is important to talk with your health care provider about some of the most common conditions that affect postmenopausal women, such as heart disease, cancer, and bone loss (osteoporosis). Adopting a healthy lifestyle and getting preventive care can help to promote your health and wellness. Those actions can also lower your chances of developing some of these common conditions. What should I know about menopause? During menopause, you may experience a number of symptoms, such as:  Moderate-to-severe hot flashes.  Night sweats.  Decrease in sex drive.  Mood swings.  Headaches.  Tiredness.  Irritability.  Memory problems.  Insomnia. Choosing to treat or not to treat menopausal changes is an individual decision that you make with your health care provider. What should I know about hormone replacement therapy and supplements? Hormone therapy products are effective for treating symptoms that are associated with menopause, such as hot flashes and night sweats. Hormone replacement carries certain risks, especially as you become older. If you are thinking about using estrogen or estrogen with progestin treatments, discuss the benefits and risks with your health care provider. What should I know about heart disease and stroke? Heart disease, heart attack, and stroke become more likely as you age. This may be due, in part, to the hormonal changes that your body experiences during menopause. These can affect how your body processes dietary fats, triglycerides, and cholesterol. Heart attack and stroke are both medical emergencies. There are many  things that you can do to help prevent heart disease and stroke:  Have your blood pressure checked at least every 1-2 years. High blood pressure causes heart disease and increases the risk of stroke.  If you are 23-60 years old, ask your health care provider if you should take aspirin to prevent a heart attack or a stroke.  Do not use any tobacco products, including cigarettes, chewing tobacco, or electronic cigarettes. If you need help quitting, ask your health care provider.  It is important to eat a healthy diet and maintain a healthy weight. ? Be sure to include plenty of vegetables, fruits, low-fat dairy products, and lean protein. ? Avoid eating foods that are high in solid fats, added sugars, or salt (sodium).  Get regular exercise. This is one of the most important things that you can do for your health. ? Try to exercise for at least 150 minutes each week. The type of exercise that you do should increase your heart rate and make you sweat. This is known as moderate-intensity exercise. ? Try to do strengthening exercises at least twice each week. Do these in addition to the moderate-intensity exercise.  Know your numbers.Ask your health care provider to check your cholesterol and your blood glucose. Continue to have your blood tested as directed by your health care provider.  What should I know about cancer screening? There are several types of cancer. Take the following steps to reduce your risk and to catch any cancer development as early as possible. Breast Cancer  Practice breast self-awareness. ? This means understanding how your breasts normally appear and feel. ? It also means doing regular breast self-exams. Let your health care provider know about any  changes, no matter how small.  If you are 23 or older, have a clinician do a breast exam (clinical breast exam or CBE) every year. Depending on your age, family history, and medical history, it may be recommended that you  also have a yearly breast X-ray (mammogram).  If you have a family history of breast cancer, talk with your health care provider about genetic screening.  If you are at high risk for breast cancer, talk with your health care provider about having an MRI and a mammogram every year.  Breast cancer (BRCA) gene test is recommended for women who have family members with BRCA-related cancers. Results of the assessment will determine the need for genetic counseling and BRCA1 and for BRCA2 testing. BRCA-related cancers include these types: ? Breast. This occurs in males or females. ? Ovarian. ? Tubal. This may also be called fallopian tube cancer. ? Cancer of the abdominal or pelvic lining (peritoneal cancer). ? Prostate. ? Pancreatic. Cervical, Uterine, and Ovarian Cancer Your health care provider may recommend that you be screened regularly for cancer of the pelvic organs. These include your ovaries, uterus, and vagina. This screening involves a pelvic exam, which includes checking for microscopic changes to the surface of your cervix (Pap test).  For women ages 21-65, health care providers may recommend a pelvic exam and a Pap test every three years. For women ages 24-65, they may recommend the Pap test and pelvic exam, combined with testing for human papilloma virus (HPV), every five years. Some types of HPV increase your risk of cervical cancer. Testing for HPV may also be done on women of any age who have unclear Pap test results.  Other health care providers may not recommend any screening for nonpregnant women who are considered low risk for pelvic cancer and have no symptoms. Ask your health care provider if a screening pelvic exam is right for you.  If you have had past treatment for cervical cancer or a condition that could lead to cancer, you need Pap tests and screening for cancer for at least 20 years after your treatment. If Pap tests have been discontinued for you, your risk factors (such  as having a new sexual partner) need to be reassessed to determine if you should start having screenings again. Some women have medical problems that increase the chance of getting cervical cancer. In these cases, your health care provider may recommend that you have screening and Pap tests more often.  If you have a family history of uterine cancer or ovarian cancer, talk with your health care provider about genetic screening.  If you have vaginal bleeding after reaching menopause, tell your health care provider.  There are currently no reliable tests available to screen for ovarian cancer. Lung Cancer Lung cancer screening is recommended for adults 82-3 years old who are at high risk for lung cancer because of a history of smoking. A yearly low-dose CT scan of the lungs is recommended if you:  Currently smoke.  Have a history of at least 30 pack-years of smoking and you currently smoke or have quit within the past 15 years. A pack-year is smoking an average of one pack of cigarettes per day for one year. Yearly screening should:  Continue until it has been 15 years since you quit.  Stop if you develop a health problem that would prevent you from having lung cancer treatment. Colorectal Cancer  This type of cancer can be detected and can often be prevented.  Routine  colorectal cancer screening usually begins at age 23 and continues through age 71.  If you have risk factors for colon cancer, your health care provider may recommend that you be screened at an earlier age.  If you have a family history of colorectal cancer, talk with your health care provider about genetic screening.  Your health care provider may also recommend using home test kits to check for hidden blood in your stool.  A small camera at the end of a tube can be used to examine your colon directly (sigmoidoscopy or colonoscopy). This is done to check for the earliest forms of colorectal cancer.  Direct examination  of the colon should be repeated every 5-10 years until age 34. However, if early forms of precancerous polyps or small growths are found or if you have a family history or genetic risk for colorectal cancer, you may need to be screened more often. Skin Cancer  Check your skin from head to toe regularly.  Monitor any moles. Be sure to tell your health care provider: ? About any new moles or changes in moles, especially if there is a change in a mole's shape or color. ? If you have a mole that is larger than the size of a pencil eraser.  If any of your family members has a history of skin cancer, especially at a young age, talk with your health care provider about genetic screening.  Always use sunscreen. Apply sunscreen liberally and repeatedly throughout the day.  Whenever you are outside, protect yourself by wearing long sleeves, pants, a wide-brimmed hat, and sunglasses. What should I know about osteoporosis? Osteoporosis is a condition in which bone destruction happens more quickly than new bone creation. After menopause, you may be at an increased risk for osteoporosis. To help prevent osteoporosis or the bone fractures that can happen because of osteoporosis, the following is recommended:  If you are 12-1 years old, get at least 1,000 mg of calcium and at least 600 mg of vitamin D per day.  If you are older than age 35 but younger than age 44, get at least 1,200 mg of calcium and at least 600 mg of vitamin D per day.  If you are older than age 31, get at least 1,200 mg of calcium and at least 800 mg of vitamin D per day. Smoking and excessive alcohol intake increase the risk of osteoporosis. Eat foods that are rich in calcium and vitamin D, and do weight-bearing exercises several times each week as directed by your health care provider. What should I know about how menopause affects my mental health? Depression may occur at any age, but it is more common as you become older. Common  symptoms of depression include:  Low or sad mood.  Changes in sleep patterns.  Changes in appetite or eating patterns.  Feeling an overall lack of motivation or enjoyment of activities that you previously enjoyed.  Frequent crying spells. Talk with your health care provider if you think that you are experiencing depression. What should I know about immunizations? It is important that you get and maintain your immunizations. These include:  Tetanus, diphtheria, and pertussis (Tdap) booster vaccine.  Influenza every year before the flu season begins.  Pneumonia vaccine.  Shingles vaccine. Your health care provider may also recommend other immunizations. This information is not intended to replace advice given to you by your health care provider. Make sure you discuss any questions you have with your health care provider. Document Released:  07/16/2005 Document Revised: 12/12/2015 Document Reviewed: 02/25/2015 Elsevier Interactive Patient Education  [employer].

## 2018-09-13 NOTE — Progress Notes (Addendum)
Colleen Martinez 1961-05-26 [id number]    History:    Presents for annual exam.  Postmenopausal on Activella with no bleeding with good symptom relief.  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  Has not had a screening colonoscopy.  Endocrinologist manages hypothyroidism.  Normal mammogram history.  Past medical history, past surgical history, family history and social history were all reviewed and documented in the EPIC chart.  Works at [employer], hours cut has had difficulty sleeping due to work stress.  Parents hypertension and arthritis.  ROS:  A ROS was performed and pertinent positives and negatives are included.  Exam:  Vitals:   09/13/18 0823  BP: 120/78  Weight: 173 lb (78.5 kg)  Height: 5\' 4"  (1.626 m)   Body mass index is 29.7 kg/m.   General appearance:  Normal Thyroid:  Symmetrical, normal in size, without palpable masses or nodularity. Respiratory  Auscultation:  Clear without wheezing or rhonchi Cardiovascular  Auscultation:  Regular rate, without rubs, murmurs or gallops  Edema/varicosities:  Not grossly evident Abdominal  Soft,nontender, without masses, guarding or rebound.  Liver/spleen:  No organomegaly noted  Hernia:  None appreciated  Skin  Inspection:  Grossly normal   Breasts: Examined lying and sitting.     Right: Without masses, retractions, discharge or axillary adenopathy.     Left: Without masses, retractions, discharge or axillary adenopathy. Gentitourinary   Inguinal/mons:  Normal without inguinal adenopathy  External genitalia:  Normal  BUS/Urethra/Skene's glands:  Normal  Vagina:  Normal  Cervix:  Normal stenotic  Uterus:   normal in size, shape and contour.  Midline and mobile  Adnexa/parametria:     Rt: Without masses or tenderness.   Lt: Without masses or tenderness.  Anus and perineum: Normal  Digital rectal exam: Normal sphincter tone without palpated masses or tenderness  Assessment/Plan:  58 y.o. MWF G3, P0 for  annual exam with complaint of increased situational stress, poor sleep.  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  Reviewed best to take shortest amount of time wishes to continue states has felt much better on.  SBEs, continue annual 3D screening mammogram, calcium rich foods, vitamin D 2000 daily encouraged.  Reviewed importance of weightbearing and balance type exercise, home safety and fall prevention discussed.  Ambien 10 mg p.o. at bedtime as needed reviewed addictive properties to use sparingly.  Xanax 0.25 mg as needed prescription, addictive properties reviewed importance of using sparingly.  Continue regular exercise, leisure activities, self-care activities encouraged.  Screening colonoscopy reviewed, [facility] information given instructed to schedule.  CBC, CMP, lipid panel, Pap.  [place], 9:53 AM 09/13/2018

## 2018-09-18 LAB — PAP IG W/ RFLX HPV ASCU

## 2018-09-18 LAB — HUMAN PAPILLOMAVIRUS, HIGH RISK: HPV DNA High Risk: DETECTED — AB

## 2018-09-19 ENCOUNTER — Encounter: Payer: Self-pay | Admitting: Women's Health

## 2018-09-19 ENCOUNTER — Other Ambulatory Visit: Payer: Self-pay

## 2018-09-21 ENCOUNTER — Other Ambulatory Visit: Payer: Self-pay

## 2018-09-21 ENCOUNTER — Ambulatory Visit: Payer: BLUE CROSS/BLUE SHIELD | Admitting: Gynecology

## 2018-09-21 ENCOUNTER — Encounter: Payer: Self-pay | Admitting: Gynecology

## 2018-09-21 VITALS — BP 118/76

## 2018-09-21 DIAGNOSIS — R87610 Atypical squamous cells of undetermined significance on cytologic smear of cervix (ASC-US): Secondary | ICD-10-CM | POA: Diagnosis not present

## 2018-09-21 DIAGNOSIS — R87810 Cervical high risk human papillomavirus (HPV) DNA test positive: Secondary | ICD-10-CM | POA: Diagnosis not present

## 2018-09-21 NOTE — Progress Notes (Signed)
    Colleen Martinez 12/28/1960 [id number]        58 y.o.  G3P0030 presents for colposcopy.  History of LGSIL and cryosurgery 2007.  Pap smear 2015 normal with negative HPV.  Pap smear early 2019 showed ASCUS positive high risk HPV.  Colposcopy was inadequate but no abnormalities seen.  ECC showed LGSIL.  Follow-up Pap smear x2 showed ASCUS with positive high risk HPV most recently 09/2018  Past medical history,surgical history, problem list, medications, allergies, family history and social history were all reviewed and documented in the EPIC chart.  Directed ROS with pertinent positives and negatives documented in the history of present illness/assessment and plan.  Exam: Caryn Bee assistant Vitals:   09/21/18 0822  BP: 118/76   General appearance:  Normal Abdomen soft nontender without masses guarding rebound Pelvic external BUS vagina with atrophic changes.  Cervix with atrophic changes.  Uterus grossly normal midline mobile nontender.  Adnexa without masses or tenderness.  Colposcopy performed after acetic acid cleanse is inadequate with no transformation zone seen.  No abnormalities seen.  The cervix was grasped with a tenaculum and gently dilated to admit a box curette and an ECC was performed.  The patient tolerated well.  Physical Exam  Genitourinary:      Assessment/Plan:  58 y.o. G3P0030 with persistent low-grade changes and positive HPV.  We again had a lengthy discussion about dysplasia, high-grade/low-grade, progression/regression and the HPV association.  Colposcopy today was inadequate with no transformation zone seen but no abnormalities seen.  ECC performed.  She will follow-up for these results and if negative or low-grade then plan expectant management with follow-up Pap smear 1 year.  If otherwise then will triage based upon results.   Anastasio Auerbach MD, 9:07 AM 09/21/2018

## 2018-09-21 NOTE — Patient Instructions (Signed)
Office will call you with biopsy results 

## 2018-09-25 LAB — TISSUE SPECIMEN

## 2018-09-25 LAB — PATHOLOGY REPORT

## 2018-09-26 ENCOUNTER — Encounter: Payer: Self-pay | Admitting: Gynecology

## 2018-10-18 DIAGNOSIS — I8312 Varicose veins of left lower extremity with inflammation: Secondary | ICD-10-CM | POA: Diagnosis not present

## 2018-10-25 DIAGNOSIS — I8312 Varicose veins of left lower extremity with inflammation: Secondary | ICD-10-CM | POA: Diagnosis not present

## 2018-11-21 DIAGNOSIS — Z713 Dietary counseling and surveillance: Secondary | ICD-10-CM | POA: Diagnosis not present

## 2018-11-24 DIAGNOSIS — E039 Hypothyroidism, unspecified: Secondary | ICD-10-CM | POA: Diagnosis not present

## 2018-12-05 DIAGNOSIS — I8312 Varicose veins of left lower extremity with inflammation: Secondary | ICD-10-CM | POA: Diagnosis not present

## 2018-12-12 DIAGNOSIS — E039 Hypothyroidism, unspecified: Secondary | ICD-10-CM | POA: Diagnosis not present

## 2018-12-27 ENCOUNTER — Other Ambulatory Visit: Payer: Self-pay

## 2018-12-27 ENCOUNTER — Ambulatory Visit (INDEPENDENT_AMBULATORY_CARE_PROVIDER_SITE_OTHER): Payer: BC Managed Care – PPO | Admitting: Family Medicine

## 2018-12-27 DIAGNOSIS — R197 Diarrhea, unspecified: Secondary | ICD-10-CM | POA: Diagnosis not present

## 2018-12-27 DIAGNOSIS — R509 Fever, unspecified: Secondary | ICD-10-CM

## 2018-12-27 NOTE — Progress Notes (Signed)
Patient ID: Colleen Martinez, female   DOB: 04/16/1961, 58 y.o.   MRN: [id number]  This visit type was conducted due to national recommendations for restrictions regarding the COVID-19 pandemic in an effort to limit this patient's exposure and mitigate transmission in our community.   Virtual Visit via Telephone Note  I connected with Colleen Martinez on 12/27/18 at  4:00 PM EDT by telephone and verified that I am speaking with the correct person using two identifiers.   I discussed the limitations, risks, security and privacy concerns of performing an evaluation and management service by telephone and the availability of in person appointments. I also discussed with the patient that there may be a patient responsible charge related to this service. The patient expressed understanding and agreed to proceed.  Location patient: home Location provider: work or home office Participants present for the call: patient, provider Patient did not have a visit in the prior 7 days to address this/these issue(s).   History of Present Illness: Onset yesterday of low-grade fever.  She felt warm and somewhat achy.  Temperature 99.4 yesterday.  Up to 100.6 today.  She had some nonbloody diarrhea and mild body aches.  No upper respiratory symptoms.  No cough.  No dyspnea.  Denies any loss of taste or smell.   Observations/Objective: Patient sounds cheerful and well on the phone. I do not appreciate any SOB. Speech and thought processing are grossly intact. Patient reported vitals:  Assessment and Plan:  Patient presents with low-grade fever, malaise, diarrhea  -Order placed for coronavirus testing. -Patient knows to stay quarantined and plenty of fluids and rest -Follow-up for any persistent or worsening symptoms  Follow Up Instructions:  -As needed for any worsening symptoms   [id number] [id number] [id number] 11-20 [id number] 21-30 I did not refer this patient for an OV in the next 24 hours for this/these  issue(s).  I discussed the assessment and treatment plan with the patient. The patient was provided an opportunity to ask questions and all were answered. The patient agreed with the plan and demonstrated an understanding of the instructions.   The patient was advised to call back or seek an in-person evaluation if the symptoms worsen or if the condition fails to improve as anticipated.  I provided 12 minutes of non-face-to-face time during this encounter.   Colleen Littler, MD

## 2018-12-29 ENCOUNTER — Other Ambulatory Visit: Payer: Self-pay

## 2018-12-29 DIAGNOSIS — Z20822 Contact with and (suspected) exposure to covid-19: Secondary | ICD-10-CM

## 2018-12-29 DIAGNOSIS — R6889 Other general symptoms and signs: Secondary | ICD-10-CM | POA: Diagnosis not present

## 2019-01-01 LAB — NOVEL CORONAVIRUS, NAA: SARS-CoV-2, NAA: NOT DETECTED

## 2019-01-02 ENCOUNTER — Telehealth: Payer: Self-pay

## 2019-01-02 NOTE — Telephone Encounter (Signed)
Called patient and LMOVM to return call  [place] for [facility] to Discuss results / PCP / recommendations / Schedule patient  Left message for patient to call back with details or send a detailed MyChart message with some of her symptoms.  CRM Created.

## 2019-01-02 NOTE — Telephone Encounter (Signed)
Copied from [place] [id number]. Topic: General - Other >> Jan 02, 2019 12:50 PM Keene Breath wrote: Reason for CRM: Patient called to ask the nurse to call her regarding some gastrointestinal issues she is still having.  Patient is not sure if she should schedule an appt. Or get some medication called in.  Please advise and call patient back at [phone]

## 2019-01-03 NOTE — Telephone Encounter (Signed)
If no fever or bloody stools, could try OTC Imodium- if not already tried.  If that does not help let us know

## 2019-01-03 NOTE — Telephone Encounter (Signed)
Called patient and LMOVM to return call  [place] for [facility] to Discuss results / PCP / recommendations / Schedule patient  Per Dr. Elease Hashimoto: If no fever or bloody stools, could try OTC Imodium- if not already tried.  If that does not help let us know  CRM Created.

## 2019-01-03 NOTE — Telephone Encounter (Signed)
Please see new message

## 2019-01-03 NOTE — Telephone Encounter (Signed)
Called patient and gave her the message from Dr. Elease Hashimoto and patient verbalized an understanding and stated that she is driving to the beach now and will call if anything changes.

## 2019-01-03 NOTE — Telephone Encounter (Signed)
Sounds like getting better.  Make sure low fat, low glycemic diet and observe for now.

## 2019-01-03 NOTE — Telephone Encounter (Signed)
Pt called and stated that she is still having some stomach cramping, frequent bowel  movements. Pt states that they are more solid than before.

## 2019-01-03 NOTE — Telephone Encounter (Signed)
Please see updated message

## 2019-01-03 NOTE — Telephone Encounter (Signed)
Pt given recommendations per Dr Elease Hashimoto; she is not having bloody stool; her BMs are more normal, but she still has intermittent persistent urge to have a BM; she says that she feels 98% better, and will not take the imodium unless she need to; will route to office for notification.

## 2019-02-07 DIAGNOSIS — E039 Hypothyroidism, unspecified: Secondary | ICD-10-CM | POA: Diagnosis not present

## 2019-02-14 DIAGNOSIS — M25522 Pain in left elbow: Secondary | ICD-10-CM | POA: Diagnosis not present

## 2019-02-14 DIAGNOSIS — M7712 Lateral epicondylitis, left elbow: Secondary | ICD-10-CM | POA: Diagnosis not present

## 2019-02-28 ENCOUNTER — Encounter: Payer: Self-pay | Admitting: Gynecology

## 2019-04-20 DIAGNOSIS — I8312 Varicose veins of left lower extremity with inflammation: Secondary | ICD-10-CM | POA: Diagnosis not present

## 2019-04-23 DIAGNOSIS — I8312 Varicose veins of left lower extremity with inflammation: Secondary | ICD-10-CM | POA: Diagnosis not present

## 2019-04-27 DIAGNOSIS — I8312 Varicose veins of left lower extremity with inflammation: Secondary | ICD-10-CM | POA: Diagnosis not present

## 2019-04-27 DIAGNOSIS — M7981 Nontraumatic hematoma of soft tissue: Secondary | ICD-10-CM | POA: Diagnosis not present

## 2019-05-11 DIAGNOSIS — I8312 Varicose veins of left lower extremity with inflammation: Secondary | ICD-10-CM | POA: Diagnosis not present

## 2019-05-11 DIAGNOSIS — M7981 Nontraumatic hematoma of soft tissue: Secondary | ICD-10-CM | POA: Diagnosis not present

## 2019-05-25 DIAGNOSIS — I8312 Varicose veins of left lower extremity with inflammation: Secondary | ICD-10-CM | POA: Diagnosis not present

## 2019-05-25 DIAGNOSIS — M7981 Nontraumatic hematoma of soft tissue: Secondary | ICD-10-CM | POA: Diagnosis not present

## 2019-06-12 ENCOUNTER — Other Ambulatory Visit: Payer: Self-pay | Admitting: Women's Health

## 2019-06-12 DIAGNOSIS — Z1231 Encounter for screening mammogram for malignant neoplasm of breast: Secondary | ICD-10-CM

## 2019-06-20 DIAGNOSIS — M7981 Nontraumatic hematoma of soft tissue: Secondary | ICD-10-CM | POA: Diagnosis not present

## 2019-07-20 ENCOUNTER — Other Ambulatory Visit: Payer: Self-pay

## 2019-07-20 ENCOUNTER — Ambulatory Visit
Admission: RE | Admit: 2019-07-20 | Discharge: 2019-07-20 | Disposition: A | Discharge status: Home / Self Care | Payer: BC Managed Care – PPO | Source: Ambulatory Visit | Attending: Women's Health | Admitting: Women's Health

## 2019-07-20 DIAGNOSIS — Z1231 Encounter for screening mammogram for malignant neoplasm of breast: Secondary | ICD-10-CM | POA: Diagnosis not present

## 2019-07-24 ENCOUNTER — Encounter: Payer: Self-pay | Admitting: Internal Medicine

## 2019-07-30 DIAGNOSIS — M7712 Lateral epicondylitis, left elbow: Secondary | ICD-10-CM | POA: Diagnosis not present

## 2019-07-30 DIAGNOSIS — M25522 Pain in left elbow: Secondary | ICD-10-CM | POA: Diagnosis not present

## 2019-08-08 ENCOUNTER — Ambulatory Visit: Payer: BC Managed Care – PPO

## 2019-08-08 ENCOUNTER — Other Ambulatory Visit: Payer: Self-pay

## 2019-08-08 VITALS — Temp 97.2°F | Ht 64.0 in | Wt 172.0 lb

## 2019-08-08 DIAGNOSIS — Z01818 Encounter for other preprocedural examination: Secondary | ICD-10-CM

## 2019-08-08 DIAGNOSIS — Z1211 Encounter for screening for malignant neoplasm of colon: Secondary | ICD-10-CM

## 2019-08-08 NOTE — Progress Notes (Signed)

## 2019-08-20 ENCOUNTER — Other Ambulatory Visit: Payer: Self-pay | Admitting: Internal Medicine

## 2019-08-20 ENCOUNTER — Ambulatory Visit (INDEPENDENT_AMBULATORY_CARE_PROVIDER_SITE_OTHER): Payer: BC Managed Care – PPO

## 2019-08-20 ENCOUNTER — Encounter: Payer: Self-pay | Admitting: Internal Medicine

## 2019-08-20 ENCOUNTER — Other Ambulatory Visit: Payer: Self-pay

## 2019-08-20 DIAGNOSIS — Z1159 Encounter for screening for other viral diseases: Secondary | ICD-10-CM

## 2019-08-20 LAB — SARS CORONAVIRUS 2 (TAT 6-24 HRS): SARS Coronavirus 2: NEGATIVE

## 2019-08-22 ENCOUNTER — Encounter: Payer: Self-pay | Admitting: Internal Medicine

## 2019-08-22 ENCOUNTER — Ambulatory Visit (AMBULATORY_SURGERY_CENTER): Payer: BC Managed Care – PPO | Admitting: Internal Medicine

## 2019-08-22 ENCOUNTER — Other Ambulatory Visit: Payer: Self-pay

## 2019-08-22 VITALS — BP 131/65 | HR 61 | Temp 95.7°F | Resp 17 | Ht 64.0 in | Wt 172.0 lb

## 2019-08-22 DIAGNOSIS — D123 Benign neoplasm of transverse colon: Secondary | ICD-10-CM | POA: Diagnosis not present

## 2019-08-22 DIAGNOSIS — Z1211 Encounter for screening for malignant neoplasm of colon: Secondary | ICD-10-CM

## 2019-08-22 DIAGNOSIS — D128 Benign neoplasm of rectum: Secondary | ICD-10-CM | POA: Diagnosis not present

## 2019-08-22 MED ORDER — SODIUM CHLORIDE 0.9 % IV SOLN: 500.0000 mL | Freq: Once | INTRAVENOUS | Status: DC

## 2019-08-22 NOTE — Op Note (Signed)
[facility] Patient Name: Colleen Martinez Procedure Date: 08/22/2019 1:37 PM MRN: [id number] Endoscopist: Gatha Mayer , MD Age: 59 Referring MD:  Date of Birth: [birth year] Gender: Female Account #: [id number] Procedure:                Colonoscopy Indications:              Screening for colorectal malignant neoplasm, This                            is the patient's first colonoscopy Medicines:                Propofol per Anesthesia, Monitored Anesthesia Care Procedure:                Pre-Anesthesia Assessment:                           - Prior to the procedure, a History and Physical                            was performed, and patient medications and                            allergies were reviewed. The patient's tolerance of                            previous anesthesia was also reviewed. The risks                            and benefits of the procedure and the sedation                            options and risks were discussed with the patient.                            All questions were answered, and informed consent                            was obtained. Prior Anticoagulants: The patient has                            taken no previous anticoagulant or antiplatelet                            agents. ASA Grade Assessment: II - A patient with                            mild systemic disease. After reviewing the risks                            and benefits, the patient was deemed in                            satisfactory condition to undergo the procedure.  After obtaining informed consent, the colonoscope                            was passed under direct vision. Throughout the                            procedure, the patient's blood pressure, pulse, and                            oxygen saturations were monitored continuously. The                            Colonoscope was introduced through the anus and    advanced to the the cecum, identified by                            appendiceal orifice and ileocecal valve. The                            colonoscopy was somewhat difficult due to                            restricted mobility of the colon. Successful                            completion of the procedure was aided by changing                            the patient's position and applying abdominal                            pressure. The patient tolerated the procedure well.                            The quality of the bowel preparation was excellent.                            The bowel preparation used was Miralax via split                            dose instruction. The ileocecal valve, appendiceal                            orifice, and rectum were photographed. Scope In: 1:44:31 PM Scope Out: 2:05:46 PM Scope Withdrawal Time: 0 hours 13 minutes 24 seconds  Total Procedure Duration: 0 hours 21 minutes 15 seconds  Findings:                 The perianal and digital rectal examinations were                            normal.                           Three sessile polyps were found in the  rectum and                            transverse colon. The polyps were diminutive in                            size. These polyps were removed with a cold snare.                            Resection and retrieval were complete. Verification                            of patient identification for the specimen was                            done. Estimated blood loss was minimal.                           Multiple diverticula were found in the sigmoid                            colon and descending colon.                           The exam was otherwise without abnormality on                            direct and retroflexion views. Complications:            No immediate complications. Estimated Blood Loss:     Estimated blood loss was minimal. Impression:               - Three diminutive polyps  in the rectum and in the                            transverse colon, removed with a cold snare.                            Resected and retrieved.                           - Diverticulosis in the sigmoid colon and in the                            descending colon.                           - The examination was otherwise normal on direct                            and retroflexion views. Recommendation:           - Patient has a contact number available for                            emergencies. The signs and symptoms of potential  delayed complications were discussed with the                            patient. Return to normal activities tomorrow.                            Written discharge instructions were provided to the                            patient.                           - Resume previous diet.                           - Continue present medications.                           - Repeat colonoscopy is recommended. The                            colonoscopy date will be determined after pathology                            results from today's exam become available for                            review. WOULD USE PEDIATRIC SCOPE - WILL BE EASIER                            TO NEGOTIATE TORTUOUS AND FIXED SIGMOID Gatha Mayer, MD 08/22/2019 2:16:08 PM This report has been signed electronically.

## 2019-08-22 NOTE — Progress Notes (Signed)
Called to room to assist during endoscopic procedure.  Patient ID and intended procedure confirmed with present staff. Received instructions for my participation in the procedure from the performing physician.  

## 2019-08-22 NOTE — Patient Instructions (Addendum)
I found and removed 3 tiny polyps. I will let you know pathology results and when to have another routine colonoscopy by mail and/or My Chart.  You also have a condition called diverticulosis - common and not usually a problem. Please read the handout provided.  I appreciate the opportunity to care for you. Gatha Mayer, MD, [facility]  Handouts given for polyps, high fiber diet and diverticulosis.  YOU HAD AN ENDOSCOPIC PROCEDURE TODAY AT [facility]:   Refer to the procedure report that was given to you for any specific questions about what was found during the examination.  If the procedure report does not answer your questions, please call your gastroenterologist to clarify.  If you requested that your care partner not be given the details of your procedure findings, then the procedure report has been included in a sealed envelope for you to review at your convenience later.  YOU SHOULD EXPECT: Some feelings of bloating in the abdomen. Passage of more gas than usual.  Walking can help get rid of the air that was put into your GI tract during the procedure and reduce the bloating. If you had a lower endoscopy (such as a colonoscopy or flexible sigmoidoscopy) you may notice spotting of blood in your stool or on the toilet paper. If you underwent a bowel prep for your procedure, you may not have a normal bowel movement for a few days.  Please Note:  You might notice some irritation and congestion in your nose or some drainage.  This is from the oxygen used during your procedure.  There is no need for concern and it should clear up in a day or so.  SYMPTOMS TO REPORT IMMEDIATELY:   Following lower endoscopy (colonoscopy or flexible sigmoidoscopy):  Excessive amounts of blood in the stool  Significant tenderness or worsening of abdominal pains  Swelling of the abdomen that is new, acute  Fever of 100F or higher  For urgent or emergent issues, a gastroenterologist can be reached  at any hour by calling [phone]. Do not use MyChart messaging for urgent concerns.    DIET:  We do recommend a small meal at first, but then you may proceed to your regular diet.  Drink plenty of fluids but you should avoid alcoholic beverages for 24 hours.  ACTIVITY:  You should plan to take it easy for the rest of today and you should NOT DRIVE or use heavy machinery until tomorrow (because of the sedation medicines used during the test).    FOLLOW UP: Our staff will call the number listed on your records 48-72 hours following your procedure to check on you and address any questions or concerns that you may have regarding the information given to you following your procedure. If we do not reach you, we will leave a message.  We will attempt to reach you two times.  During this call, we will ask if you have developed any symptoms of COVID 19. If you develop any symptoms (ie: fever, flu-like symptoms, shortness of breath, cough etc.) before then, please call [phone].  If you test positive for Covid 19 in the 2 weeks post procedure, please call and report this information to [place].    If any biopsies were taken you will be contacted by phone or by letter within the next 1-3 weeks.  Please call us at [phone] if you have not heard about the biopsies in 3 weeks.    SIGNATURES/CONFIDENTIALITY: You and/or your care  partner have signed paperwork which will be entered into your electronic medical record.  These signatures attest to the fact that that the information above on your After Visit Summary has been reviewed and is understood.  Full responsibility of the confidentiality of this discharge information lies with you and/or your care-partner.

## 2019-08-22 NOTE — Progress Notes (Signed)
Temp-JB VS-KA  Pt's states no medical or surgical changes since previsit or office visit.

## 2019-08-22 NOTE — Progress Notes (Signed)
Pt Drowsy. VSS. To PACU, report to RN. No anesthetic complications noted.  

## 2019-08-24 ENCOUNTER — Telehealth: Payer: Self-pay

## 2019-08-24 NOTE — Telephone Encounter (Signed)
  Follow up Call-  Call back number 08/22/2019  Post procedure Call Back phone  # [phone]  Permission to leave phone message Yes  Some recent data might be hidden     Patient questions:  Do you have a fever, pain , or abdominal swelling? No. Pain Score  0 *  Have you tolerated food without any problems? Yes.    Have you been able to return to your normal activities? Yes.    Do you have any questions about your discharge instructions: Diet   No. Medications  No. Follow up visit  No.  Do you have questions or concerns about your Care? No.  Actions: * If pain score is 4 or above: No action needed, pain <4. 1. Have you developed a fever since your procedure? no  2.   Have you had an respiratory symptoms (SOB or cough) since your procedure? no  3.   Have you tested positive for COVID 19 since your procedure no  4.   Have you had any family members/close contacts diagnosed with the COVID 19 since your procedure?  no   If yes to any of these questions please route to Joylene John, RN and Alphonsa Gin, Therapist, sports.

## 2019-08-27 DIAGNOSIS — M25522 Pain in left elbow: Secondary | ICD-10-CM | POA: Diagnosis not present

## 2019-08-28 ENCOUNTER — Other Ambulatory Visit: Payer: Self-pay

## 2019-08-29 ENCOUNTER — Ambulatory Visit (INDEPENDENT_AMBULATORY_CARE_PROVIDER_SITE_OTHER): Payer: BC Managed Care – PPO | Admitting: Family Medicine

## 2019-08-29 ENCOUNTER — Encounter: Payer: Self-pay | Admitting: Internal Medicine

## 2019-08-29 ENCOUNTER — Encounter: Payer: Self-pay | Admitting: Family Medicine

## 2019-08-29 VITALS — BP 124/86 | HR 74 | Temp 98.2°F | Wt 171.8 lb

## 2019-08-29 DIAGNOSIS — Z860100 Personal history of colon polyps, unspecified: Secondary | ICD-10-CM

## 2019-08-29 DIAGNOSIS — Z0000 Encounter for general adult medical examination without abnormal findings: Secondary | ICD-10-CM

## 2019-08-29 HISTORY — DX: Personal history of colonic polyps: Z86.010

## 2019-08-29 HISTORY — DX: Personal history of colon polyps, unspecified: Z86.0100

## 2019-08-29 LAB — BASIC METABOLIC PANEL
BUN: 11 mg/dL (ref 6–23)
CO2: 28 mEq/L (ref 19–32)
Calcium: 9.1 mg/dL (ref 8.4–10.5)
Chloride: 105 mEq/L (ref 96–112)
Creatinine, Ser: 0.88 mg/dL (ref 0.40–1.20)
GFR: 66.38 mL/min (ref >60.00)
Glucose, Bld: 101 mg/dL — ABNORMAL HIGH (ref 70–99)
Potassium: 4.7 mEq/L (ref 3.5–5.1)
Sodium: 139 mEq/L (ref 135–145)

## 2019-08-29 LAB — TSH: TSH: 1.51 u[IU]/mL (ref 0.35–4.50)

## 2019-08-29 LAB — CBC WITH DIFFERENTIAL/PLATELET
Basophils Absolute: 0 10*3/uL (ref 0.0–0.1)
Basophils Relative: 0.8 % (ref 0.0–3.0)
Eosinophils Absolute: 0.1 10*3/uL (ref 0.0–0.7)
Eosinophils Relative: 3.2 % (ref 0.0–5.0)
HCT: 42 % (ref 36.0–46.0)
Hemoglobin: 14.4 g/dL (ref 12.0–15.0)
Lymphocytes Relative: 35.7 % (ref 12.0–46.0)
Lymphs Abs: 1.1 10*3/uL (ref 0.7–4.0)
MCHC: 34.3 g/dL (ref 30.0–36.0)
MCV: 93.6 fl (ref 78.0–100.0)
Monocytes Absolute: 0.4 10*3/uL (ref 0.1–1.0)
Monocytes Relative: 12.6 % — ABNORMAL HIGH (ref 3.0–12.0)
Neutro Abs: 1.5 10*3/uL (ref 1.4–7.7)
Neutrophils Relative %: 47.7 % (ref 43.0–77.0)
Platelets: 159 10*3/uL (ref 150.0–400.0)
RBC: 4.49 Mil/uL (ref 3.87–5.11)
RDW: 12.1 % (ref 11.5–15.5)
WBC: 3 10*3/uL — ABNORMAL LOW (ref 4.0–10.5)

## 2019-08-29 LAB — LIPID PANEL
Cholesterol: 243 mg/dL — ABNORMAL HIGH (ref 0–200)
HDL: 70.3 mg/dL (ref >39.00)
LDL Cholesterol: 144 mg/dL — ABNORMAL HIGH (ref 0–99)
NonHDL: 172.3
Total CHOL/HDL Ratio: 3
Triglycerides: 141 mg/dL (ref 0.0–149.0)
VLDL: 28.2 mg/dL (ref 0.0–40.0)

## 2019-08-29 LAB — HEPATIC FUNCTION PANEL
ALT: 11 U/L (ref 0–35)
AST: 13 U/L (ref 0–37)
Albumin: 4.5 g/dL (ref 3.5–5.2)
Alkaline Phosphatase: 54 U/L (ref 39–117)
Bilirubin, Direct: 0.1 mg/dL (ref 0.0–0.3)
Total Bilirubin: 0.7 mg/dL (ref 0.2–1.2)
Total Protein: 6.7 g/dL (ref 6.0–8.3)

## 2019-08-29 NOTE — Patient Instructions (Signed)

## 2019-08-29 NOTE — Progress Notes (Signed)
Subjective:     Patient ID: Colleen Martinez, female   DOB: 05/27/1961, 59 y.o.   MRN: [id number]  HPI   Colleen Martinez is seen for physical exam.  She sees gynecologist yearly for Pap smears and had recent mammogram which was normal.  She had colonoscopy last week.  She had a couple of adenomatous polyps.  She had vertigo in the past has had recent bout for few weeks which is gradually getting better.  Symptoms were triggered by turning to the left.  She is scheduled to see her gynecologist in April.  No history of hepatitis C screening but low risk.  Her immunizations are up-to-date.  She states she has felt somewhat poorly in general for the past several months.  She thinks all this may be stress related.  She has increased work and home stress which may be contributing.  She is sleeping fairly well.  Appetite is stable.  She had her first Moderna vaccine recently.  Family history reviewed.  She had 7 year old sister recently diagnosed with reportedly type 1 diabetes  Past Medical History:  Diagnosis Date  . Cervical dysplasia 2007   LGSIL  . Chicken pox   . Heart murmur    functional  . Thyroid disease    Hypothyroidism   Past Surgical History:  Procedure Laterality Date  . AUGMENTATION MAMMAPLASTY    . BREAST SURGERY  2004   breast augmentation  . GYNECOLOGIC CRYOSURGERY  2007   LGSIL normal paps since  . liposuction  2004   stomach , bi-lat thighs    reports that she has never smoked. She has never used smokeless tobacco. She reports current alcohol use of about 1.0 standard drinks of alcohol per week. She reports that she does not use drugs. family history includes Arthritis in her father and mother; Diabetes in her sister; Hyperlipidemia in her father and mother; Ovarian cancer in her mother. No Known Allergies  Wt Readings from Last 3 Encounters:  08/29/19 171 lb 12.8 oz (77.9 kg)  08/22/19 172 lb (78 kg)  08/08/19 172 lb (78 kg)     Review of Systems  Constitutional:  Negative for activity change, appetite change, fever and unexpected weight change.  HENT: Negative for ear pain, hearing loss, sore throat and trouble swallowing.   Eyes: Negative for visual disturbance.  Respiratory: Negative for cough and shortness of breath.   Cardiovascular: Negative for chest pain and palpitations.  Gastrointestinal: Negative for abdominal pain, blood in stool, constipation and diarrhea.  Endocrine: Negative for polydipsia and polyuria.  Genitourinary: Negative for dysuria and hematuria.  Musculoskeletal: Negative for arthralgias, back pain and myalgias.  Skin: Negative for rash.  Neurological: Positive for dizziness. Negative for syncope and headaches.  Hematological: Negative for adenopathy.  Psychiatric/Behavioral: Negative for confusion and dysphoric mood.       Objective:   Physical Exam Vitals reviewed.  Constitutional:      Appearance: She is well-developed.  HENT:     Head: Normocephalic and atraumatic.  Eyes:     Pupils: Pupils are equal, round, and reactive to light.  Neck:     Thyroid: No thyromegaly.  Cardiovascular:     Rate and Rhythm: Normal rate and regular rhythm.     Heart sounds: Normal heart sounds. No murmur.  Pulmonary:     Effort: No respiratory distress.     Breath sounds: Normal breath sounds. No wheezing or rales.  Abdominal:     General: Bowel sounds are normal. There is no distension.  Palpations: Abdomen is soft. There is no mass.     Tenderness: There is no abdominal tenderness. There is no guarding or rebound.  Genitourinary:    Comments: Per GYN Musculoskeletal:        General: Normal range of motion.     Cervical back: Normal range of motion and neck supple.  Lymphadenopathy:     Cervical: No cervical adenopathy.  Skin:    Findings: No rash.  Neurological:     Mental Status: She is alert and oriented to person, place, and time.     Cranial Nerves: No cranial nerve deficit.     Deep Tendon Reflexes: Reflexes  normal.  Psychiatric:        Behavior: Behavior normal.        Thought Content: Thought content normal.        Judgment: Judgment normal.        Assessment:     Physical exam.  Generally healthy 59 year old female.  Recent colonoscopy with a couple of adenomatous polyps.  She is overweight but no other major comorbidities.  She has had some residual vertigo from what sounds like benign peripheral positional vertigo to the left.    Plan:     -Epley maneuver exercises were given. -Obtain screening labs.  She has not had previous hepatitis C antibody and this will be done.  She states she has had previous HIV screen which is negative. -Continue close follow-up with GYN -Discussed weight loss strategies.  She is considering various plans at this time  Eulas Post MD [facility]

## 2019-08-30 LAB — HEPATITIS C ANTIBODY
Hepatitis C Ab: NONREACTIVE
SIGNAL TO CUT-OFF: 0.01 (ref <1.00)

## 2019-09-04 DIAGNOSIS — M7712 Lateral epicondylitis, left elbow: Secondary | ICD-10-CM | POA: Diagnosis not present

## 2019-09-17 ENCOUNTER — Other Ambulatory Visit: Payer: Self-pay

## 2019-09-18 ENCOUNTER — Ambulatory Visit (INDEPENDENT_AMBULATORY_CARE_PROVIDER_SITE_OTHER): Payer: BC Managed Care – PPO | Admitting: Women's Health

## 2019-09-18 ENCOUNTER — Encounter: Payer: Self-pay | Admitting: Women's Health

## 2019-09-18 VITALS — BP 130/80 | Ht 64.0 in | Wt 169.0 lb

## 2019-09-18 DIAGNOSIS — F5101 Primary insomnia: Secondary | ICD-10-CM

## 2019-09-18 DIAGNOSIS — Z01419 Encounter for gynecological examination (general) (routine) without abnormal findings: Secondary | ICD-10-CM

## 2019-09-18 DIAGNOSIS — Z79890 Hormone replacement therapy (postmenopausal): Secondary | ICD-10-CM

## 2019-09-18 DIAGNOSIS — R87612 Low grade squamous intraepithelial lesion on cytologic smear of cervix (LGSIL): Secondary | ICD-10-CM | POA: Diagnosis not present

## 2019-09-18 MED ORDER — ESTRADIOL-NORETHINDRONE ACET 0.5-0.1 MG PO TABS: 1.0000 | ORAL_TABLET | Freq: Every day | ORAL | 4 refills | Status: DC

## 2019-09-18 MED ORDER — ZOLPIDEM TARTRATE 10 MG PO TABS: 10.0000 mg | ORAL_TABLET | Freq: Every evening | ORAL | 1 refills | Status: DC | PRN

## 2019-09-18 NOTE — Progress Notes (Signed)
Colleen Martinez June 15, 1960 [id number]    History:    Presents for annual exam.  Postmenopausal on HRT since 2017 with good relief of menopausal symptoms with no bleeding.  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  Normal mammogram history, history of a negative breast biopsy.  08/2019 3 benign colon polyps 5-year follow-up.  Hypothyroid primary care manages labs and meds.  Past medical history, past surgical history, family history and social history were all reviewed and documented in the EPIC chart.  Works at [employer].  Parents hypertension arthritis.  ROS:  A ROS was performed and pertinent positives and negatives are included.  Exam:  Vitals:   09/18/55 0800  BP: 130/80  Weight: 169 lb (76.7 kg)  Height: 5\' 4"  (1.626 m)   Body mass index is 29.01 kg/m.   General appearance:  Normal Thyroid:  Symmetrical, normal in size, without palpable masses or nodularity. Respiratory  Auscultation:  Clear without wheezing or rhonchi Cardiovascular  Auscultation:  Regular rate, without rubs, murmurs or gallops  Edema/varicosities:  Not grossly evident Abdominal  Soft,nontender, without masses, guarding or rebound.  Liver/spleen:  No organomegaly noted  Hernia:  None appreciated  Skin  Inspection:  Grossly normal   Breasts: Examined lying and sitting. Bilateral implants    Right: Without masses, retractions, discharge or axillary adenopathy.     Left: Without masses, retractions, discharge or axillary adenopathy. Gentitourinary   Inguinal/mons:  Normal without inguinal adenopathy  External genitalia:  Normal  BUS/Urethra/Skene's glands:  Normal  Vagina:  Normal  Cervix:  Normal  Uterus:   normal in size, shape and contour.  Midline and mobile  Adnexa/parametria:     Rt: Without masses or tenderness.   Lt: Without masses or tenderness.  Anus and perineum: Normal  Digital rectal exam: Normal sphincter tone without palpated masses or tenderness  Assessment/Plan:  59  y.o. MWF G3, P0 for annual exam with no complaints of vaginal discharge, urinary symptoms, abdominal pain or dyspareunia.  Postmenopausal on HRT with no bleeding Hypothyroidism-primary care manages labs and meds 2019 LGSIL 08/2019 3 benign colon polyps 5-year follow-up  Plan: HRT reviewed risks of blood clots, strokes and breast cancer, has been on for 4 years and wishes to continue reviewed women's health initiative and encouraged to decrease at this 6 to 7 years.  Agreeable with plan.  Refill of Activella 0.5/0.1 prescription, proper use given and reviewed.  SBEs, continue annual 3D screening mammogram, calcium rich foods, vitamin D 2000 IUs daily.  Encouraged increased regular cardio and balance type exercise such as yoga.  Refill of Ambien 10 mg at bedtime as needed per request aware of addictive properties and to use sparingly.  Pap with HR HPV typing.    [place], 8:39 AM 09/18/2019

## 2019-09-18 NOTE — Patient Instructions (Addendum)
Vit D 2000 iu daily It has been pleasure knowing you Health Maintenance for Postmenopausal Women Menopause is a normal process in which your ability to get pregnant comes to an end. This process happens slowly over many months or years, usually between the ages of 27 and 63. Menopause is complete when you have missed your menstrual periods for 12 months. It is important to talk with your health care provider about some of the most common conditions that affect women after menopause (postmenopausal women). These include heart disease, cancer, and bone loss (osteoporosis). Adopting a healthy lifestyle and getting preventive care can help to promote your health and wellness. The actions you take can also lower your chances of developing some of these common conditions. What should I know about menopause? During menopause, you may get a number of symptoms, such as:  Hot flashes. These can be moderate or severe.  Night sweats.  Decrease in sex drive.  Mood swings.  Headaches.  Tiredness.  Irritability.  Memory problems.  Insomnia. Choosing to treat or not to treat these symptoms is a decision that you make with your health care provider. Do I need hormone replacement therapy?  Hormone replacement therapy is effective in treating symptoms that are caused by menopause, such as hot flashes and night sweats.  Hormone replacement carries certain risks, especially as you become older. If you are thinking about using estrogen or estrogen with progestin, discuss the benefits and risks with your health care provider. What is my risk for heart disease and stroke? The risk of heart disease, heart attack, and stroke increases as you age. One of the causes may be a change in the body's hormones during menopause. This can affect how your body uses dietary fats, triglycerides, and cholesterol. Heart attack and stroke are medical emergencies. There are many things that you can do to help prevent heart  disease and stroke. Watch your blood pressure  High blood pressure causes heart disease and increases the risk of stroke. This is more likely to develop in people who have high blood pressure readings, are of African descent, or are overweight.  Have your blood pressure checked: ? Every 3-5 years if you are 3-78 years of age. ? Every year if you are 74 years old or older. Eat a healthy diet   Eat a diet that includes plenty of vegetables, fruits, low-fat dairy products, and lean protein.  Do not eat a lot of foods that are high in solid fats, added sugars, or sodium. Get regular exercise Get regular exercise. This is one of the most important things you can do for your health. Most adults should:  Try to exercise for at least 150 minutes each week. The exercise should increase your heart rate and make you sweat (moderate-intensity exercise).  Try to do strengthening exercises at least twice each week. Do these in addition to the moderate-intensity exercise.  Spend less time sitting. Even light physical activity can be beneficial. Other tips  Work with your health care provider to achieve or maintain a healthy weight.  Do not use any products that contain nicotine or tobacco, such as cigarettes, e-cigarettes, and chewing tobacco. If you need help quitting, ask your health care provider.  Know your numbers. Ask your health care provider to check your cholesterol and your blood sugar (glucose). Continue to have your blood tested as directed by your health care provider. Do I need screening for cancer? Depending on your health history and family history, you may need  to have cancer screening at different stages of your life. This may include screening for:  Breast cancer.  Cervical cancer.  Lung cancer.  Colorectal cancer. What is my risk for osteoporosis? After menopause, you may be at increased risk for osteoporosis. Osteoporosis is a condition in which bone destruction happens  more quickly than new bone creation. To help prevent osteoporosis or the bone fractures that can happen because of osteoporosis, you may take the following actions:  If you are 64-56 years old, get at least 1,000 mg of calcium and at least 600 mg of vitamin D per day.  If you are older than age 52 but younger than age 18, get at least 1,200 mg of calcium and at least 600 mg of vitamin D per day.  If you are older than age 25, get at least 1,200 mg of calcium and at least 800 mg of vitamin D per day. Smoking and drinking excessive alcohol increase the risk of osteoporosis. Eat foods that are rich in calcium and vitamin D, and do weight-bearing exercises several times each week as directed by your health care provider. How does menopause affect my mental health? Depression may occur at any age, but it is more common as you become older. Common symptoms of depression include:  Low or sad mood.  Changes in sleep patterns.  Changes in appetite or eating patterns.  Feeling an overall lack of motivation or enjoyment of activities that you previously enjoyed.  Frequent crying spells. Talk with your health care provider if you think that you are experiencing depression. General instructions See your health care provider for regular wellness exams and vaccines. This may include:  Scheduling regular health, dental, and eye exams.  Getting and maintaining your vaccines. These include: ? Influenza vaccine. Get this vaccine each year before the flu season begins. ? Pneumonia vaccine. ? Shingles vaccine. ? Tetanus, diphtheria, and pertussis (Tdap) booster vaccine. Your health care provider may also recommend other immunizations. Tell your health care provider if you have ever been abused or do not feel safe at home. Summary  Menopause is a normal process in which your ability to get pregnant comes to an end.  This condition causes hot flashes, night sweats, decreased interest in sex, mood  swings, headaches, or lack of sleep.  Treatment for this condition may include hormone replacement therapy.  Take actions to keep yourself healthy, including exercising regularly, eating a healthy diet, watching your weight, and checking your blood pressure and blood sugar levels.  Get screened for cancer and depression. Make sure that you are up to date with all your vaccines. This information is not intended to replace advice given to you by your health care provider. Make sure you discuss any questions you have with your health care provider. Document Revised: 05/17/2018 Document Reviewed: 05/17/2018 Elsevier Patient Education  2020 [employer].

## 2019-09-24 LAB — PAP, TP IMAGING W/ HPV RNA, RFLX HPV TYPE 16,18/45: HPV DNA High Risk: DETECTED — AB

## 2019-10-11 ENCOUNTER — Ambulatory Visit: Payer: BC Managed Care – PPO | Admitting: Obstetrics and Gynecology

## 2019-10-15 ENCOUNTER — Other Ambulatory Visit: Payer: Self-pay

## 2019-10-15 DIAGNOSIS — Z79890 Hormone replacement therapy (postmenopausal): Secondary | ICD-10-CM

## 2019-10-15 MED ORDER — ESTRADIOL-NORETHINDRONE ACET 0.5-0.1 MG PO TABS: 1.0000 | ORAL_TABLET | Freq: Every day | ORAL | 4 refills | Status: DC

## 2019-10-18 ENCOUNTER — Other Ambulatory Visit: Payer: Self-pay

## 2019-10-19 ENCOUNTER — Encounter: Payer: Self-pay | Admitting: Obstetrics and Gynecology

## 2019-10-19 ENCOUNTER — Ambulatory Visit: Payer: BC Managed Care – PPO | Admitting: Obstetrics and Gynecology

## 2019-10-19 VITALS — BP 126/80

## 2019-10-19 DIAGNOSIS — N870 Mild cervical dysplasia: Secondary | ICD-10-CM

## 2019-10-19 DIAGNOSIS — N882 Stricture and stenosis of cervix uteri: Secondary | ICD-10-CM

## 2019-10-19 DIAGNOSIS — R87612 Low grade squamous intraepithelial lesion on cytologic smear of cervix (LGSIL): Secondary | ICD-10-CM | POA: Diagnosis not present

## 2019-10-19 NOTE — Progress Notes (Signed)
   Colleen Martinez 06-Jul-1960 [id number]  SUBJECTIVE:  59 y.o. G36P0030 female presents for a colposcopy.  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  Colposcopy exams were negative for visual findings in 2019 and 2020, but the exams were inadequate, and ECC only showed koilocytic atypia both times.  She had a history of cryosurgery for LGSIL in 2007.  Her Pap smear in 2015 was normal with a negative HPV.  Allergies: Patient has no known allergies.  Patient's last menstrual period was 07/20/2015.  Past medical history,surgical history, problem list, medications, allergies, family history and social history were all reviewed and documented as reviewed in the EPIC chart.   OBJECTIVE:  BP 126/80 (BP Location: Right Arm, Patient Position: Sitting, Cuff Size: Normal)   LMP 07/20/2015  The patient appears well, alert, oriented x 3, in no distress. PELVIC EXAM: VULVA: normal appearing vulva with no masses, tenderness or lesions, VAGINA: normal appearing vagina with normal color and discharge, no lesions, CERVIX: normal appearing cervix without discharge or lesions, UTERUS: uterus is normal size, shape, consistency and nontender, ADNEXA: normal adnexa in size, nontender and no masses  Physical Exam Genitourinary:      Procedure note Colposcopy The cervix was visualized and a dilute acetic acid cleanse was performed.  Close inspection of the cervix, cervical fornices, upper vaginal sidewalls did not reveal any obvious abnormality.  Just outside of the external cervical os at 2:00 there was an area of faint acetowhite change highlighted with application of Lugol's solution.  The external cervical os was noted to be stenotic and the transformation zone was able to be visualized.  Allis clamp was applied to the anterior lip of the cervix and a cervical os finder was used to puncture the cervical stenosis at the  external os and perform gentle dilation to open up the cervical canal.  The transformation zone could still not be seen in its entirety, but the endocervical canal did contain some friable tissues.  Tischler biopsy forceps was used to sample the possible acetowhite abnormality at the opening of the external os.  Endocervical pathology was collected with the Kevorkian curette.  Hemostasis was observed at the end of the procedure.  Patient tolerated the procedure well.  Visual impression consistent with inflammation and possible mild dysplasia.  Chaperone: Colleen Martinez present during the examination  ASSESSMENT:  59 y.o. 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  We discussed the progression of the cytology from ASCUS to LGSIL that was notable on her Pap smear this year.  We discussed that in cases of worsening Pap smear results but no findings on colposcopy, we may need to consider performing a LEEP or cone biopsy to obtain more cervical tissue to improve the diagnosis ability.  Further management will be dictated by the results of the biopsy.  All questions were answered by the end of the visit.   Joseph Pierini MD 10/19/19

## 2019-10-23 LAB — TISSUE PATH REPORT

## 2019-10-23 LAB — PATHOLOGY REPORT

## 2019-12-12 DIAGNOSIS — E039 Hypothyroidism, unspecified: Secondary | ICD-10-CM | POA: Diagnosis not present

## 2020-01-09 DIAGNOSIS — E039 Hypothyroidism, unspecified: Secondary | ICD-10-CM | POA: Diagnosis not present

## 2020-04-02 DIAGNOSIS — B078 Other viral warts: Secondary | ICD-10-CM | POA: Diagnosis not present

## 2020-04-02 DIAGNOSIS — L821 Other seborrheic keratosis: Secondary | ICD-10-CM | POA: Diagnosis not present

## 2020-06-30 ENCOUNTER — Other Ambulatory Visit: Payer: Self-pay | Admitting: Obstetrics and Gynecology

## 2020-06-30 DIAGNOSIS — Z1231 Encounter for screening mammogram for malignant neoplasm of breast: Secondary | ICD-10-CM

## 2020-07-27 DIAGNOSIS — H04221 Epiphora due to insufficient drainage, right lacrimal gland: Secondary | ICD-10-CM | POA: Diagnosis not present

## 2020-08-13 ENCOUNTER — Ambulatory Visit: Payer: BC Managed Care – PPO

## 2020-09-16 DIAGNOSIS — H2513 Age-related nuclear cataract, bilateral: Secondary | ICD-10-CM | POA: Diagnosis not present

## 2020-09-22 DIAGNOSIS — M722 Plantar fascial fibromatosis: Secondary | ICD-10-CM | POA: Diagnosis not present

## 2020-09-23 ENCOUNTER — Encounter: Payer: Self-pay | Admitting: Nurse Practitioner

## 2020-09-23 ENCOUNTER — Other Ambulatory Visit (HOSPITAL_COMMUNITY)
Admission: RE | Admit: 2020-09-23 | Discharge: 2020-09-23 | Disposition: A | Discharge status: Home / Self Care | Payer: BC Managed Care – PPO | Source: Ambulatory Visit | Attending: Nurse Practitioner | Admitting: Nurse Practitioner

## 2020-09-23 ENCOUNTER — Other Ambulatory Visit: Payer: Self-pay

## 2020-09-23 ENCOUNTER — Ambulatory Visit (INDEPENDENT_AMBULATORY_CARE_PROVIDER_SITE_OTHER): Payer: BC Managed Care – PPO | Admitting: Nurse Practitioner

## 2020-09-23 VITALS — BP 124/80 | Ht 64.5 in | Wt 167.0 lb

## 2020-09-23 DIAGNOSIS — Z79890 Hormone replacement therapy (postmenopausal): Secondary | ICD-10-CM | POA: Diagnosis not present

## 2020-09-23 DIAGNOSIS — Z01419 Encounter for gynecological examination (general) (routine) without abnormal findings: Secondary | ICD-10-CM | POA: Insufficient documentation

## 2020-09-23 DIAGNOSIS — F5101 Primary insomnia: Secondary | ICD-10-CM

## 2020-09-23 MED ORDER — ZOLPIDEM TARTRATE 10 MG PO TABS: 10.0000 mg | ORAL_TABLET | Freq: Every evening | ORAL | 1 refills | Status: DC | PRN

## 2020-09-23 MED ORDER — ESTRADIOL-NORETHINDRONE ACET 0.5-0.1 MG PO TABS: 1.0000 | ORAL_TABLET | Freq: Every day | ORAL | 3 refills | Status: DC

## 2020-09-23 NOTE — Patient Instructions (Signed)

## 2020-09-23 NOTE — Addendum Note (Signed)
Addended byMarny Lowenstein on: 09/23/2020 09:00 AM   Modules accepted: Orders

## 2020-09-23 NOTE — Progress Notes (Addendum)
   Colleen Martinez Mar 30, 1961 [id number]   History:  60 y.o. G3P0030 presents for annual exam. No GYN complaints. Postmenopausal - on HRT. 2007 cryo, normal paps until 09/2018-ASCUS positive HPV negative biopsy, 09/2019 LGSIL positive HPV negative biopsy. Normal mammogram history. Hypothyroidism managed by PCP. Takes Ambien sparingly for insomnia.   Gynecologic History Patient's last menstrual period was 07/20/2015.   Contraception/Family planning: post menopausal status  Health Maintenance Last Pap: 09/2019. Results were: LGSIL positive HPV, negative biopsy Last mammogram: 07/20/2019. Results were: normal Last colonoscopy: 08/2019 Last Dexa: N/A  Past medical history, past surgical history, family history and social history were all reviewed and documented in the EPIC chart. Married. Works in Chief Executive Officer for [employer].   ROS:  A ROS was performed and pertinent positives and negatives are included.  Exam:  Vitals:   09/23/20 0814  BP: 124/80  Weight: 167 lb (75.8 kg)  Height: 5' 4.5" (1.638 m)   Body mass index is 28.22 kg/m.  General appearance:  Normal Thyroid:  Symmetrical, normal in size, without palpable masses or nodularity. Respiratory  Auscultation:  Clear without wheezing or rhonchi Cardiovascular  Auscultation:  Regular rate, without rubs, murmurs or gallops  Edema/varicosities:  Not grossly evident Abdominal  Soft,nontender, without masses, guarding or rebound.  Liver/spleen:  No organomegaly noted  Hernia:  None appreciated  Skin  Inspection:  Grossly normal Breasts: Examined lying and sitting.   Right: Without masses, retractions, nipple discharge or axillary adenopathy.   Left: Without masses, retractions, nipple discharge or axillary adenopathy. Gentitourinary   Inguinal/mons:  Normal without inguinal adenopathy  External genitalia:  Normal appearing vulva with no masses, tenderness, or lesions  BUS/Urethra/Skene's glands:  Normal  Vagina:  Normal appearing  with normal color and discharge, no lesions. Atrophic changes.  Cervix:  Normal appearing without discharge or lesions. Stenotic  Uterus:  Normal in size, shape and contour.  Midline and mobile, nontender  Adnexa/parametria:     Rt: Normal in size, without masses or tenderness.   Lt: Normal in size, without masses or tenderness.  Anus and perineum: Normal  Digital rectal exam: Normal sphincter tone without palpated masses or tenderness  Assessment/Plan:  60 y.o. G3P0030 for annual exam.   Well female exam with routine gynecological exam - Plan: Cytology - PAP( [facility]). Education provided on SBEs, importance of preventative screenings, current guidelines, high calcium diet, regular exercise, and multivitamin daily. Labs with PCP.   Hormone replacement therapy (HRT) - Plan: Estradiol-Norethindrone Acet 0.5-0.1 MG tablet daily. We discussed risks and benefits of continued use. She would like to continue. Refill x 1 year provided.   Primary insomnia - Plan: zolpidem (AMBIEN) 10 MG tablet as needed for sleep. She is aware of addicting properties and takes sparingly.   Screening for cervical cancer - 2007 cryo, normal paps until 09/2018-ASCUS positive HPV negative biopsy, 09/2019 LGSIL positive HPV negative biopsy. Pap with HR HPV today.   Screening for breast cancer - Normal mammogram history.  Continue annual screenings.  Normal breast exam today. She has mammogram scheduled for 10/17/2020.  Screening for colon cancer - 08/2019 colonoscopy. Will repeat at GI's recommended interval.   Return in 1 year for annual.   Tamela Gammon DNP, 8:39 AM 09/23/2020

## 2020-09-26 LAB — CYTOLOGY - PAP
Comment: NEGATIVE
High risk HPV: POSITIVE — AB

## 2020-10-17 ENCOUNTER — Other Ambulatory Visit: Payer: Self-pay | Admitting: Nurse Practitioner

## 2020-10-17 ENCOUNTER — Ambulatory Visit
Admission: RE | Admit: 2020-10-17 | Discharge: 2020-10-17 | Disposition: A | Discharge status: Home / Self Care | Payer: BC Managed Care – PPO | Source: Ambulatory Visit | Attending: Obstetrics and Gynecology | Admitting: Obstetrics and Gynecology

## 2020-10-17 ENCOUNTER — Other Ambulatory Visit: Payer: Self-pay

## 2020-10-17 DIAGNOSIS — Z1231 Encounter for screening mammogram for malignant neoplasm of breast: Secondary | ICD-10-CM | POA: Diagnosis not present

## 2020-10-22 DIAGNOSIS — B078 Other viral warts: Secondary | ICD-10-CM | POA: Diagnosis not present

## 2020-10-22 DIAGNOSIS — L719 Rosacea, unspecified: Secondary | ICD-10-CM | POA: Diagnosis not present

## 2020-10-22 DIAGNOSIS — B079 Viral wart, unspecified: Secondary | ICD-10-CM | POA: Diagnosis not present

## 2020-12-19 DIAGNOSIS — E039 Hypothyroidism, unspecified: Secondary | ICD-10-CM | POA: Diagnosis not present

## 2021-01-05 DIAGNOSIS — E669 Obesity, unspecified: Secondary | ICD-10-CM | POA: Diagnosis not present

## 2021-01-05 DIAGNOSIS — E039 Hypothyroidism, unspecified: Secondary | ICD-10-CM | POA: Diagnosis not present

## 2021-01-28 DIAGNOSIS — E669 Obesity, unspecified: Secondary | ICD-10-CM | POA: Diagnosis not present

## 2021-01-28 DIAGNOSIS — E039 Hypothyroidism, unspecified: Secondary | ICD-10-CM | POA: Diagnosis not present

## 2021-02-20 DIAGNOSIS — L719 Rosacea, unspecified: Secondary | ICD-10-CM | POA: Diagnosis not present

## 2021-02-20 DIAGNOSIS — L570 Actinic keratosis: Secondary | ICD-10-CM | POA: Diagnosis not present

## 2021-03-18 DIAGNOSIS — E669 Obesity, unspecified: Secondary | ICD-10-CM | POA: Diagnosis not present

## 2021-03-18 DIAGNOSIS — E039 Hypothyroidism, unspecified: Secondary | ICD-10-CM | POA: Diagnosis not present

## 2021-04-27 DIAGNOSIS — S76012A Strain of muscle, fascia and tendon of left hip, initial encounter: Secondary | ICD-10-CM | POA: Diagnosis not present

## 2021-05-04 ENCOUNTER — Telehealth (INDEPENDENT_AMBULATORY_CARE_PROVIDER_SITE_OTHER): Payer: BC Managed Care – PPO | Admitting: Family Medicine

## 2021-05-04 ENCOUNTER — Other Ambulatory Visit: Payer: Self-pay

## 2021-05-04 DIAGNOSIS — U071 COVID-19: Secondary | ICD-10-CM

## 2021-05-04 NOTE — Progress Notes (Signed)
Patient ID: Colleen Martinez, female   DOB: [birth year], 60 y.o.   MRN: [id number]   This visit type was conducted due to national recommendations for restrictions regarding the COVID-19 pandemic in an effort to limit this patient's exposure and mitigate transmission in our community.   Virtual Visit via Video Note  I connected with Colleen Martinez  on 05/04/21 at  3:00 PM EST by a video enabled telemedicine application and verified that I am speaking with the correct person using two identifiers.  Location patient: home Location provider:work or home office Persons participating in the virtual visit: patient, provider  I discussed the limitations of evaluation and management by telemedicine and the availability of in person appointments. The patient expressed understanding and agreed to proceed.   HPI: Colleen Martinez has COVID-19 infection.  1 week ago.  Today she developed sore throat along with some sinus congestion and cough.  She had fairly severe symptoms for couple days but those symptoms are improving in terms of sore throat.  She has never had a fever.  No body aches.  She did home COVID test which came back positive.  She and her husband were in [place] November 12 through 19 and her onset of symptoms was the 21st so she suspects that she probably picked up something on the trip back.  Denies any nausea, vomiting, or diarrhea.  No dyspnea.  Main symptoms now are nasal congestion and cough which is relatively mild.   ROS: See pertinent positives and negatives per HPI.  Past Medical History:  Diagnosis Date   Cervical dysplasia 2007   LGSIL   Chicken pox    Heart murmur    functional   Hx of colonic polyps 08/29/2019   Thyroid disease    Hypothyroidism    Past Surgical History:  Procedure Laterality Date   AUGMENTATION MAMMAPLASTY     BREAST SURGERY  2004   breast augmentation   GYNECOLOGIC CRYOSURGERY  2007   LGSIL normal paps since   liposuction  2004   stomach , bi-lat  thighs    Family History  Problem Relation Age of Onset   Arthritis Mother    Hyperlipidemia Mother    Ovarian cancer Mother    Arthritis Father    Hyperlipidemia Father    Diabetes Sister    Colon cancer Neg Hx    Colon polyps Neg Hx    Esophageal cancer Neg Hx    Rectal cancer Neg Hx    Stomach cancer Neg Hx     SOCIAL HX: Non-smoker   Current Outpatient Medications:    Cholecalciferol (VITAMIN D PO), Take by mouth., Disp: , Rfl:    Estradiol-Norethindrone Acet 0.5-0.1 MG tablet, Take 1 tablet by mouth daily., Disp: 90 tablet, Rfl: 3   levothyroxine (SYNTHROID) 88 MCG tablet, Synthroid 88 mcg tablet, Disp: , Rfl:    meloxicam (MOBIC) 7.5 MG tablet, Mobic 7.5 mg tablet  1 tablet daily with meals as needed, Disp: , Rfl:    zolpidem (AMBIEN) 10 MG tablet, Take 1 tablet (10 mg total) by mouth at bedtime as needed for sleep., Disp: 30 tablet, Rfl: 1  EXAM:  VITALS per patient if applicable:  GENERAL: alert, oriented, appears well and in no acute distress  HEENT: atraumatic, conjunttiva clear, no obvious abnormalities on inspection of external nose and ears  NECK: normal movements of the head and neck  LUNGS: on inspection no signs of respiratory distress, breathing rate appears normal, no obvious gross SOB, gasping or wheezing  CV: no obvious cyanosis  MS: moves all visible extremities without noticeable abnormality  PSYCH/NEURO: pleasant and cooperative, no obvious depression or anxiety, speech and thought processing grossly intact  ASSESSMENT AND PLAN:  Discussed the following assessment and plan:   COVID-19 infection.  Patient had onset 1 week ago today.  Overall slightly improved.  We discussed the fact that she is out of the 5-day window for antiviral medications.  She is relatively low risk.  Continue plenty of rest and symptomatic treatment at this time.  Follow-up for any persistent or worsening symptoms.  Consider over-the-counter Mucinex for congestion  symptoms    I discussed the assessment and treatment plan with the patient. The patient was provided an opportunity to ask questions and all were answered. The patient agreed with the plan and demonstrated an understanding of the instructions.   The patient was advised to call back or seek an in-person evaluation if the symptoms worsen or if the condition fails to improve as anticipated.     Carolann Littler, MD

## 2021-05-11 DIAGNOSIS — D485 Neoplasm of uncertain behavior of skin: Secondary | ICD-10-CM | POA: Diagnosis not present

## 2021-05-11 DIAGNOSIS — C4400 Unspecified malignant neoplasm of skin of lip: Secondary | ICD-10-CM | POA: Diagnosis not present

## 2021-05-22 ENCOUNTER — Telehealth (INDEPENDENT_AMBULATORY_CARE_PROVIDER_SITE_OTHER): Payer: BC Managed Care – PPO | Admitting: Family Medicine

## 2021-05-22 ENCOUNTER — Other Ambulatory Visit: Payer: Self-pay

## 2021-05-22 DIAGNOSIS — R0982 Postnasal drip: Secondary | ICD-10-CM | POA: Diagnosis not present

## 2021-05-22 DIAGNOSIS — R059 Cough, unspecified: Secondary | ICD-10-CM | POA: Diagnosis not present

## 2021-05-22 MED ORDER — HYDROCOD POLST-CPM POLST ER 10-8 MG/5ML PO SUER: 5.0000 mL | Freq: Two times a day (BID) | ORAL | 0 refills | Status: DC | PRN

## 2021-05-22 NOTE — Progress Notes (Signed)
Patient ID: Colleen Martinez, female   DOB: 14-Apr-1961, 60 y.o.   MRN: [id number]   This visit type was conducted due to national recommendations for restrictions regarding the COVID-19 pandemic in an effort to limit this patient's exposure and mitigate transmission in our community.   Virtual Visit via Video Note  I connected with Colleen Martinez on 05/22/21 at 10:15 AM EST by a video enabled telemedicine application and verified that I am speaking with the correct person using two identifiers.  Location patient: home Location provider:work or home office Persons participating in the virtual visit: patient, provider  I discussed the limitations of evaluation and management by telemedicine and the availability of in person appointments. The patient expressed understanding and agreed to proceed.   HPI: Colleen Martinez had recent COVID infection after she and her husband return from [place] back at the end of November.  She felt like she was recovered from that overall but has had some continued clear nasal drainage and postnasal drip with cough.  Her cough and drainage seem to be much worse at night.  She had difficulty sleeping at times because of the cough.  She avoids some over-the-counter cough medicine such as NyQuil because they seem to trigger restless leg symptoms.  Patient has done well with Tussionex in the past without any adverse side effects.  She is requesting the same.  She denies any fever at this time.  No upper teeth pain.  No consistent headaches.  No purulent drainage.  Nasal drainage mostly clear.   ROS: See pertinent positives and negatives per HPI.  Past Medical History:  Diagnosis Date   Cervical dysplasia 2007   LGSIL   Chicken pox    Heart murmur    functional   Hx of colonic polyps 08/29/2019   Thyroid disease    Hypothyroidism    Past Surgical History:  Procedure Laterality Date   AUGMENTATION MAMMAPLASTY     BREAST SURGERY  2004   breast augmentation    GYNECOLOGIC CRYOSURGERY  2007   LGSIL normal paps since   liposuction  2004   stomach , bi-lat thighs    Family History  Problem Relation Age of Onset   Arthritis Mother    Hyperlipidemia Mother    Ovarian cancer Mother    Arthritis Father    Hyperlipidemia Father    Diabetes Sister    Colon cancer Neg Hx    Colon polyps Neg Hx    Esophageal cancer Neg Hx    Rectal cancer Neg Hx    Stomach cancer Neg Hx     SOCIAL HX: Non-smoker   Current Outpatient Medications:    chlorpheniramine-HYDROcodone (TUSSIONEX PENNKINETIC ER) 10-8 MG/5ML SUER, Take 5 mLs by mouth every 12 (twelve) hours as needed for cough., Disp: 115 mL, Rfl: 0   Cholecalciferol (VITAMIN D PO), Take by mouth., Disp: , Rfl:    Estradiol-Norethindrone Acet 0.5-0.1 MG tablet, Take 1 tablet by mouth daily., Disp: 90 tablet, Rfl: 3   levothyroxine (SYNTHROID) 88 MCG tablet, Synthroid 88 mcg tablet, Disp: , Rfl:    meloxicam (MOBIC) 7.5 MG tablet, Mobic 7.5 mg tablet  1 tablet daily with meals as needed, Disp: , Rfl:    zolpidem (AMBIEN) 10 MG tablet, Take 1 tablet (10 mg total) by mouth at bedtime as needed for sleep., Disp: 30 tablet, Rfl: 1  EXAM:  VITALS per patient if applicable:  GENERAL: alert, oriented, appears well and in no acute distress  HEENT: atraumatic, conjunttiva clear, no obvious  abnormalities on inspection of external nose and ears  NECK: normal movements of the head and neck  LUNGS: on inspection no signs of respiratory distress, breathing rate appears normal, no obvious gross SOB, gasping or wheezing  CV: no obvious cyanosis  MS: moves all visible extremities without noticeable abnormality  PSYCH/NEURO: pleasant and cooperative, no obvious depression or anxiety, speech and thought processing grossly intact  ASSESSMENT AND PLAN:  Discussed the following assessment and plan:  Cough.  Patient has significant postnasal drip which is likely triggering.  Suspect viral and post COVID.  She  does not have any fever or dyspnea.  -We agreed to send in limited Tussionex 115 mL 1 teaspoon nightly as needed for severe cough. -Follow-up for any fever, persistent cough, or other concerns     I discussed the assessment and treatment plan with the patient. The patient was provided an opportunity to ask questions and all were answered. The patient agreed with the plan and demonstrated an understanding of the instructions.   The patient was advised to call back or seek an in-person evaluation if the symptoms worsen or if the condition fails to improve as anticipated.     Carolann Littler, MD

## 2021-06-10 DIAGNOSIS — E039 Hypothyroidism, unspecified: Secondary | ICD-10-CM | POA: Diagnosis not present

## 2021-06-17 DIAGNOSIS — E669 Obesity, unspecified: Secondary | ICD-10-CM | POA: Diagnosis not present

## 2021-06-17 DIAGNOSIS — E039 Hypothyroidism, unspecified: Secondary | ICD-10-CM | POA: Diagnosis not present

## 2021-07-01 DIAGNOSIS — C4401 Basal cell carcinoma of skin of lip: Secondary | ICD-10-CM | POA: Diagnosis not present

## 2021-07-29 DIAGNOSIS — L905 Scar conditions and fibrosis of skin: Secondary | ICD-10-CM | POA: Diagnosis not present

## 2021-09-10 ENCOUNTER — Other Ambulatory Visit: Payer: Self-pay

## 2021-09-10 DIAGNOSIS — Z79890 Hormone replacement therapy (postmenopausal): Secondary | ICD-10-CM

## 2021-09-10 MED ORDER — ESTRADIOL-NORETHINDRONE ACET 0.5-0.1 MG PO TABS: 1.0000 | ORAL_TABLET | Freq: Every day | ORAL | 0 refills | Status: DC

## 2021-09-10 NOTE — Addendum Note (Signed)
Addended by: Ramond Craver on: 09/10/2021 11:39 AM ? ? Modules accepted: Orders ? ?

## 2021-09-10 NOTE — Telephone Encounter (Signed)
Rx was sent to [facility] because I failed to make sure pharmacy set correctly prior to sending call.  I canceled Rx with [facility] and rerouted to [facility]. ?

## 2021-09-10 NOTE — Telephone Encounter (Signed)
Patient has AEX appt scheduled 09/30/21 with Tifffany, NP. Last AEX 09/23/20. ? ?Patient will run out of Estradiol-Norethindrone Acet 0.5-0.'1mg'$  tablet prior to visit. She gets her prescriptions through mail order for lower copayment with ins co but they require 90 days supply Rx.  ?

## 2021-09-17 ENCOUNTER — Other Ambulatory Visit: Payer: Self-pay | Admitting: Family Medicine

## 2021-09-17 DIAGNOSIS — Z1231 Encounter for screening mammogram for malignant neoplasm of breast: Secondary | ICD-10-CM

## 2021-09-29 NOTE — Progress Notes (Signed)
? ?Colleen Martinez June 16, 1960 [id number] ? ? ?History:  61 y.o. G3P0030 presents for annual exam. She complains of increased stress and anxiety due to work. She is unable to unwind and relax after work and is also affecting her sleep. She is interested in something to take as needed. Postmenopausal - on HRT. 2007 cryo, normal paps until 09/2018-ASCUS positive HPV negative biopsy, 09/2019 LGSIL positive HPV negative biopsy, 09/2020 LGSIL positive HR HPV. Normal mammogram history. Hypothyroidism managed by PCP. Takes Ambien sparingly for insomnia. She is down 25 pound with diet and exercise.  ? ?Gynecologic History ?Patient's last menstrual period was 07/20/2015. ?  ?Contraception/Family planning: post menopausal status ?Sexually active: Yes ? ?Health Maintenance ?Last Pap: 09/23/2020. Results were: LGSIL positive HPV, negative biopsy ?Last mammogram: 10/17/2020. Results were: Normal ?Last colonoscopy: 08/22/2019. Results were: Pre-cancerous polyps, 5-year recall ?Last Dexa: Never ? ?Past medical history, past surgical history, family history and social history were all reviewed and documented in the EPIC chart. Married. Works in Chief Executive Officer for [employer], travels often for work. Husband works for [employer]. She believes mother has osteoporosis.  ? ?ROS:  A ROS was performed and pertinent positives and negatives are included. ? ?Exam: ? ?Vitals:  ? 09/30/21 0755  ?BP: 120/70  ?Weight: 144 lb (65.3 kg)  ?Height: '5\' 4"'$  (1.626 m)  ? ? ?Body mass index is 24.72 kg/m?. ? ?General appearance:  Normal ?Thyroid:  Symmetrical, normal in size, without palpable masses or nodularity. ?Respiratory ? Auscultation:  Clear without wheezing or rhonchi ?Cardiovascular ? Auscultation:  Regular rate, without rubs, murmurs or gallops ? Edema/varicosities:  Not grossly evident ?Abdominal ? Soft,nontender, without masses, guarding or rebound. ? Liver/spleen:  No organomegaly noted ? Hernia:  None appreciated ?Skin ? Inspection:  Grossly  normal ?Breasts: Examined lying and sitting. Bilateral implants noted.  ? Right: Without masses, retractions, nipple discharge or axillary adenopathy. ? ? Left: Without masses, retractions, nipple discharge or axillary adenopathy. ?Gentitourinary  ? Inguinal/mons:  Normal without inguinal adenopathy ? External genitalia:  Normal appearing vulva with no masses, tenderness, or lesions ? BUS/Urethra/Skene's glands:  Normal ? Vagina:  Normal appearing with normal color and discharge, no lesions. Atrophic changes. ? Cervix:  Normal appearing without discharge or lesions. Stenotic ? Uterus:  Normal in size, shape and contour.  Midline and mobile, nontender ? Adnexa/parametria:   ?  Rt: Normal in size, without masses or tenderness. ?  Lt: Normal in size, without masses or tenderness. ? Anus and perineum: Normal ? Digital rectal exam: Normal sphincter tone without palpated masses or tenderness ? ?Assessment/Plan:  61 y.o. G3P0030 for annual exam.  ? ?Well female exam with routine gynecological exam - Plan: Cytology - PAP( [place]). Education provided on SBEs, importance of preventative screenings, current guidelines, high calcium diet, regular exercise, and multivitamin daily. Labs with PCP. Congratulated on weight loss.  ? ?Low grade squamous intraepithelial lesion (LGSIL) on cervical Pap smear - Plan: Cytology - PAP( [place]).  ? ?Postmenopausal hormone therapy - Plan: Estradiol-Norethindrone Acet 0.5-0.1 MG tablet daily. We discussed risks and benefits of continued use. She is interested in weaning so we discussed how to do this.  ? ?Primary insomnia - Plan: zolpidem (AMBIEN) 10 MG tablet as needed for sleep. She is aware of addicting properties and takes sparingly.  ? ?Family history of osteoporosis in mother - Plan: DG Bone Density. Taking Vitamin D supplement and exercising regularly with cardio and weight training.  ? ?Postmenopausal - Plan: DG Bone Density. On HRT,  no bleeding.  ? ?Anxiety - Plan: ALPRAZolam  (XANAX) 0.25 MG tablet as needed. #15 with 0 refills provided. She is aware to use sparingly and she will discuss with PCP further.  ? ?Screening for cervical cancer - 2007 cryo, normal paps until 09/2018-ASCUS positive HPV negative biopsy, 09/2019 LGSIL positive HPV negative biopsy, 09/2020 LGSIL positive HR HPV. Pap with HR HPV today.  ? ?Screening for breast cancer - Normal mammogram history.  Continue annual screenings.  Normal breast exam today. ? ?Screening for colon cancer - 08/2019 colonoscopy. Will repeat at 5-year interval per GI's recommendation. ? ?Return in 1 year for annual. ? ? ?[place], 8:22 AM 09/30/2021 ? ?

## 2021-09-30 ENCOUNTER — Encounter: Payer: Self-pay | Admitting: Nurse Practitioner

## 2021-09-30 ENCOUNTER — Telehealth: Payer: Self-pay | Admitting: *Deleted

## 2021-09-30 ENCOUNTER — Other Ambulatory Visit (HOSPITAL_COMMUNITY)
Admission: RE | Admit: 2021-09-30 | Discharge: 2021-09-30 | Disposition: A | Discharge status: Home / Self Care | Payer: BC Managed Care – PPO | Source: Ambulatory Visit | Attending: Nurse Practitioner | Admitting: Nurse Practitioner

## 2021-09-30 ENCOUNTER — Other Ambulatory Visit: Payer: Self-pay | Admitting: Nurse Practitioner

## 2021-09-30 ENCOUNTER — Ambulatory Visit (INDEPENDENT_AMBULATORY_CARE_PROVIDER_SITE_OTHER): Payer: BC Managed Care – PPO | Admitting: Nurse Practitioner

## 2021-09-30 VITALS — BP 120/70 | Ht 64.0 in | Wt 144.0 lb

## 2021-09-30 DIAGNOSIS — E785 Hyperlipidemia, unspecified: Secondary | ICD-10-CM

## 2021-09-30 DIAGNOSIS — F5101 Primary insomnia: Secondary | ICD-10-CM

## 2021-09-30 DIAGNOSIS — Z8262 Family history of osteoporosis: Secondary | ICD-10-CM

## 2021-09-30 DIAGNOSIS — Z79890 Hormone replacement therapy (postmenopausal): Secondary | ICD-10-CM | POA: Diagnosis not present

## 2021-09-30 DIAGNOSIS — R87612 Low grade squamous intraepithelial lesion on cytologic smear of cervix (LGSIL): Secondary | ICD-10-CM | POA: Diagnosis not present

## 2021-09-30 DIAGNOSIS — Z780 Asymptomatic menopausal state: Secondary | ICD-10-CM

## 2021-09-30 DIAGNOSIS — F419 Anxiety disorder, unspecified: Secondary | ICD-10-CM

## 2021-09-30 DIAGNOSIS — Z01419 Encounter for gynecological examination (general) (routine) without abnormal findings: Secondary | ICD-10-CM | POA: Insufficient documentation

## 2021-09-30 MED ORDER — ZOLPIDEM TARTRATE 10 MG PO TABS: 10.0000 mg | ORAL_TABLET | Freq: Every evening | ORAL | 1 refills | Status: DC | PRN

## 2021-09-30 MED ORDER — ALPRAZOLAM 0.25 MG PO TABS: 0.2500 mg | ORAL_TABLET | Freq: Every evening | ORAL | 0 refills | Status: DC | PRN

## 2021-09-30 NOTE — Telephone Encounter (Signed)
Great, order has been placed. Thanks.  ?

## 2021-09-30 NOTE — Telephone Encounter (Signed)
Patient said she does not need any other labs only lipid. Lab appointment schedule for 45/4/23 @ 8:40 am aware to fast.  ?

## 2021-09-30 NOTE — Telephone Encounter (Signed)
Patient was seen today for annual exam called states she forgot to ask if cholesterol could be checked? Okay to return for labs? ?

## 2021-09-30 NOTE — Telephone Encounter (Signed)
Yes, that's fine. Does she want her other labs done. CBC, CMP? I thought her PCP was managing her thyroid. Does she need TSH? She needs to be fasting. ?

## 2021-10-02 LAB — CYTOLOGY - PAP
Adequacy: ABSENT
Comment: NEGATIVE
High risk HPV: POSITIVE — AB

## 2021-10-05 ENCOUNTER — Other Ambulatory Visit: Payer: Self-pay

## 2021-10-05 ENCOUNTER — Other Ambulatory Visit: Payer: Self-pay | Admitting: Nurse Practitioner

## 2021-10-05 DIAGNOSIS — B9689 Other specified bacterial agents as the cause of diseases classified elsewhere: Secondary | ICD-10-CM

## 2021-10-05 DIAGNOSIS — R87810 Cervical high risk human papillomavirus (HPV) DNA test positive: Secondary | ICD-10-CM

## 2021-10-05 DIAGNOSIS — N870 Mild cervical dysplasia: Secondary | ICD-10-CM

## 2021-10-05 MED ORDER — METRONIDAZOLE 500 MG PO TABS: 500.0000 mg | ORAL_TABLET | Freq: Two times a day (BID) | ORAL | 0 refills | Status: DC

## 2021-10-08 ENCOUNTER — Other Ambulatory Visit: Payer: BC Managed Care – PPO

## 2021-10-19 ENCOUNTER — Ambulatory Visit
Admission: RE | Admit: 2021-10-19 | Discharge: 2021-10-19 | Disposition: A | Discharge status: Home / Self Care | Payer: BC Managed Care – PPO | Source: Ambulatory Visit | Attending: Family Medicine | Admitting: Family Medicine

## 2021-10-19 DIAGNOSIS — Z1231 Encounter for screening mammogram for malignant neoplasm of breast: Secondary | ICD-10-CM

## 2021-11-17 ENCOUNTER — Other Ambulatory Visit: Payer: Self-pay | Admitting: Nurse Practitioner

## 2021-11-17 ENCOUNTER — Ambulatory Visit (INDEPENDENT_AMBULATORY_CARE_PROVIDER_SITE_OTHER): Payer: BC Managed Care – PPO

## 2021-11-17 DIAGNOSIS — Z13820 Encounter for screening for osteoporosis: Secondary | ICD-10-CM

## 2021-11-17 DIAGNOSIS — M85852 Other specified disorders of bone density and structure, left thigh: Secondary | ICD-10-CM | POA: Diagnosis not present

## 2021-11-17 DIAGNOSIS — Z8262 Family history of osteoporosis: Secondary | ICD-10-CM

## 2021-11-17 DIAGNOSIS — Z780 Asymptomatic menopausal state: Secondary | ICD-10-CM | POA: Diagnosis not present

## 2021-12-14 DIAGNOSIS — E039 Hypothyroidism, unspecified: Secondary | ICD-10-CM | POA: Diagnosis not present

## 2021-12-17 ENCOUNTER — Telehealth: Payer: Self-pay | Admitting: Family Medicine

## 2021-12-17 NOTE — Telephone Encounter (Signed)
Last lipid labs--08/29/2019 Last physical- 08/29/19 Last video visit-05/22/21  Can this patient receive a refill?

## 2021-12-17 NOTE — Telephone Encounter (Signed)
Pt requested a lipid/cholesterol test.

## 2021-12-18 ENCOUNTER — Ambulatory Visit: Payer: BC Managed Care – PPO | Admitting: Obstetrics & Gynecology

## 2021-12-18 ENCOUNTER — Other Ambulatory Visit (HOSPITAL_COMMUNITY)
Admission: RE | Admit: 2021-12-18 | Discharge: 2021-12-18 | Disposition: A | Discharge status: Home / Self Care | Payer: BC Managed Care – PPO | Source: Ambulatory Visit | Attending: Obstetrics & Gynecology | Admitting: Obstetrics & Gynecology

## 2021-12-18 ENCOUNTER — Encounter: Payer: Self-pay | Admitting: Obstetrics & Gynecology

## 2021-12-18 ENCOUNTER — Other Ambulatory Visit: Payer: Self-pay

## 2021-12-18 VITALS — BP 120/86 | HR 72

## 2021-12-18 DIAGNOSIS — R87810 Cervical high risk human papillomavirus (HPV) DNA test positive: Secondary | ICD-10-CM | POA: Diagnosis not present

## 2021-12-18 DIAGNOSIS — N870 Mild cervical dysplasia: Secondary | ICD-10-CM | POA: Insufficient documentation

## 2021-12-18 DIAGNOSIS — R87612 Low grade squamous intraepithelial lesion on cytologic smear of cervix (LGSIL): Secondary | ICD-10-CM

## 2021-12-18 DIAGNOSIS — Z86010 Personal history of colonic polyps: Secondary | ICD-10-CM

## 2021-12-18 NOTE — Telephone Encounter (Signed)
My mistake.   Questions below I intended to type was, is okay to place order and when should patient have OV follow up.  Spoke with patient, Lab appointment scheduled for 12/29/21.   Lipid lab order placed.

## 2021-12-18 NOTE — Telephone Encounter (Signed)
Noted  

## 2021-12-18 NOTE — Progress Notes (Signed)
    Colleen Martinez [birth year] [id number]        61 y.o.  G3P0030   RP: LGSIL/HPV HR Pos for Colposcopy  HPI: LGSIL/HPV HR Pos on Pap 09/2021.  Mild Dysplasia on Colpo in 10/2019.  Remote h/o LEEP.    OB History  Gravida Para Term Preterm AB Living  3       3 0  SAB IAB Ectopic Multiple Live Births  3            # Outcome Date GA Lbr Len/2nd Weight Sex Delivery Anes PTL Lv  3 SAB           2 SAB           1 SAB             Past medical history,surgical history, problem list, medications, allergies, family history and social history were all reviewed and documented in the EPIC chart.   Directed ROS with pertinent positives and negatives documented in the history of present illness/assessment and plan.  Exam:  Vitals:   12/18/21 0908  BP: 120/86  Pulse: 72  SpO2: 99%   General appearance:  Normal  Colposcopy Procedure Note Colleen Martinez 12/18/2021  Indications: LGSIL/HPV HR Pos for Colpo  Procedure Details  The risks and benefits of the procedure and Written informed consent obtained.  Speculum placed in vagina and excellent visualization of cervix achieved, cervix swabbed x 3 with acetic acid solution.  Findings:  Cervix colposcopy: Physical Exam Genitourinary:       Vaginal colposcopy: Normal  Vulvar colposcopy: Normal  Perirectal colposcopy: Normal  The cervix was sprayed with Hurricane before performing the cervical biopsies.  Specimens: HPV 16-18-45.  Cervical Bx at 12 O'Clock  Complications: None, good hemostasis with Silver Nitrate . Plan:  Management per results.  If any level of Dysplasia is present, patient would like to proceed with a LEEP.   Assessment/Plan:  61 y.o. G3P0030   1. Low grade squamous intraepithelial lesion (LGSIL) on cervical Pap smear LGSIL/HPV HR Pos on Pap 09/2021.  Mild Dysplasia on Colpo in 10/2019.  Remote h/o LEEP. Colposcopy findings reviewed with patient.  Post procedure precautions.  Management per results.   If any level of Dysplasia is present, patient would like to proceed with a LEEP. - Cervicovaginal ancillary only( [place]) - Surgical pathology( [place]/ [facility])  2. Dysplasia of cervix, low grade (CIN 1) - Cervicovaginal ancillary only( [place]) - Surgical pathology( [place])  3. Papanicolaou smear of cervix with positive high risk human papilloma virus (HPV) test - Cervicovaginal ancillary only( [place]) - Surgical pathology( [place]/ [facility])   Counseling on high risk HPV and cervical dysplasia, review of documentation and management including the possibility of a LEEP discussed for 15 minutes.  Colleen Bruins MD, 9:14 AM 12/18/2021

## 2021-12-21 DIAGNOSIS — E039 Hypothyroidism, unspecified: Secondary | ICD-10-CM | POA: Diagnosis not present

## 2021-12-21 DIAGNOSIS — E669 Obesity, unspecified: Secondary | ICD-10-CM | POA: Diagnosis not present

## 2021-12-22 LAB — SURGICAL PATHOLOGY

## 2021-12-25 LAB — CERVICOVAGINAL ANCILLARY ONLY
Comment: NEGATIVE
Comment: NEGATIVE
HPV 16: NEGATIVE
HPV 18 / 45: NEGATIVE

## 2021-12-29 ENCOUNTER — Other Ambulatory Visit (INDEPENDENT_AMBULATORY_CARE_PROVIDER_SITE_OTHER): Payer: BC Managed Care – PPO

## 2021-12-29 DIAGNOSIS — Z86010 Personal history of colonic polyps: Secondary | ICD-10-CM

## 2021-12-29 LAB — LIPID PANEL
Cholesterol: 221 mg/dL — ABNORMAL HIGH (ref 0–200)
HDL: 72.7 mg/dL (ref >39.00)
LDL Cholesterol: 127 mg/dL — ABNORMAL HIGH (ref 0–99)
NonHDL: 148.53
Total CHOL/HDL Ratio: 3
Triglycerides: 108 mg/dL (ref 0.0–149.0)
VLDL: 21.6 mg/dL (ref 0.0–40.0)

## 2021-12-29 NOTE — Addendum Note (Signed)
Addended by: Octavio Manns E on: 12/29/2021 07:34 AM   Modules accepted: Orders

## 2022-01-16 IMAGING — MG DIGITAL SCREENING BREAST BILAT IMPLANT W/ TOMO W/ CAD
8 of 12 series · 8 of 28 positions shown · non-contrast
Comparison: Previous exam(s).

CLINICAL DATA: Screening.

EXAM:
DIGITAL SCREENING BILATERAL MAMMOGRAM WITH IMPLANTS, CAD AND TOMO
The patient has implants. Standard and implant displaced views were
performed.

[L MLO]
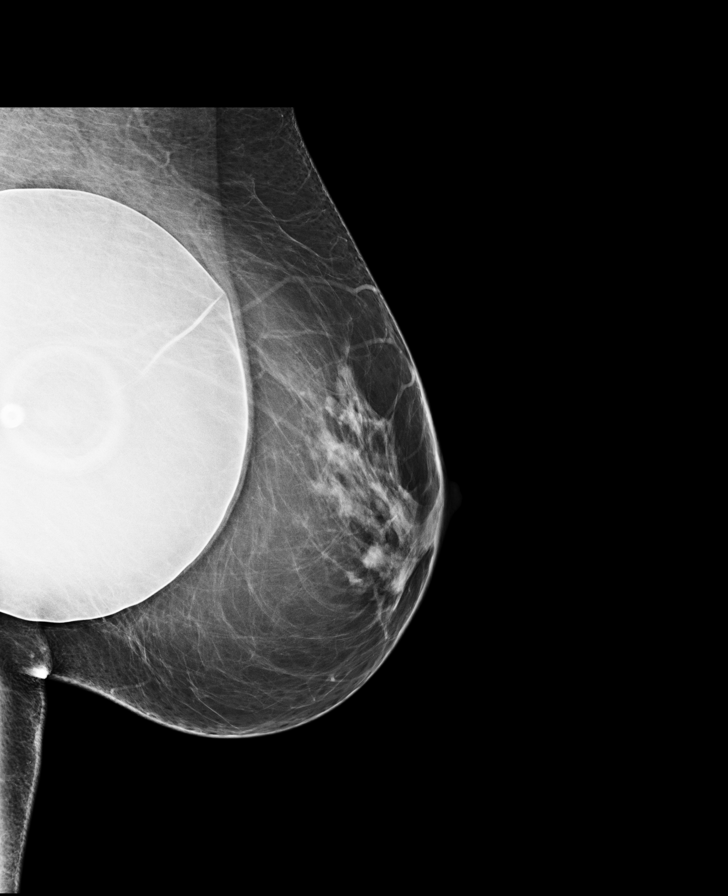

[R MLO]
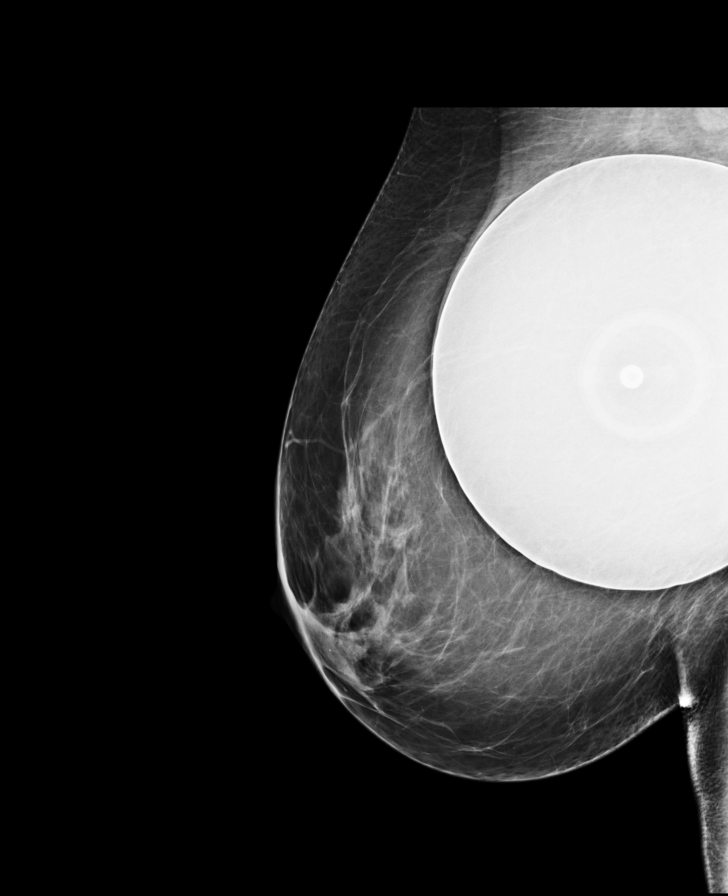

[R CC]
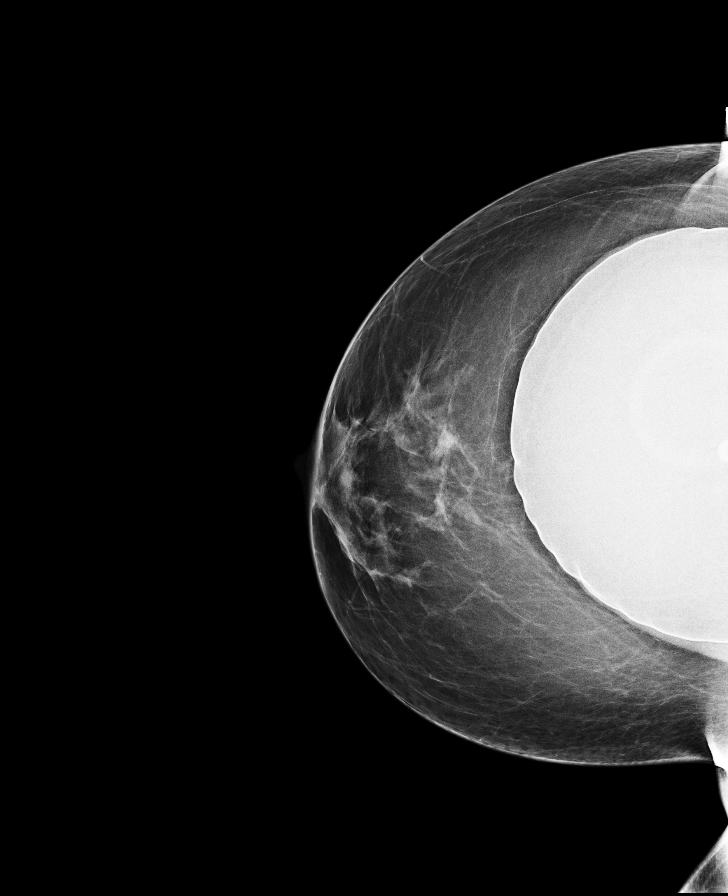

[L CC]
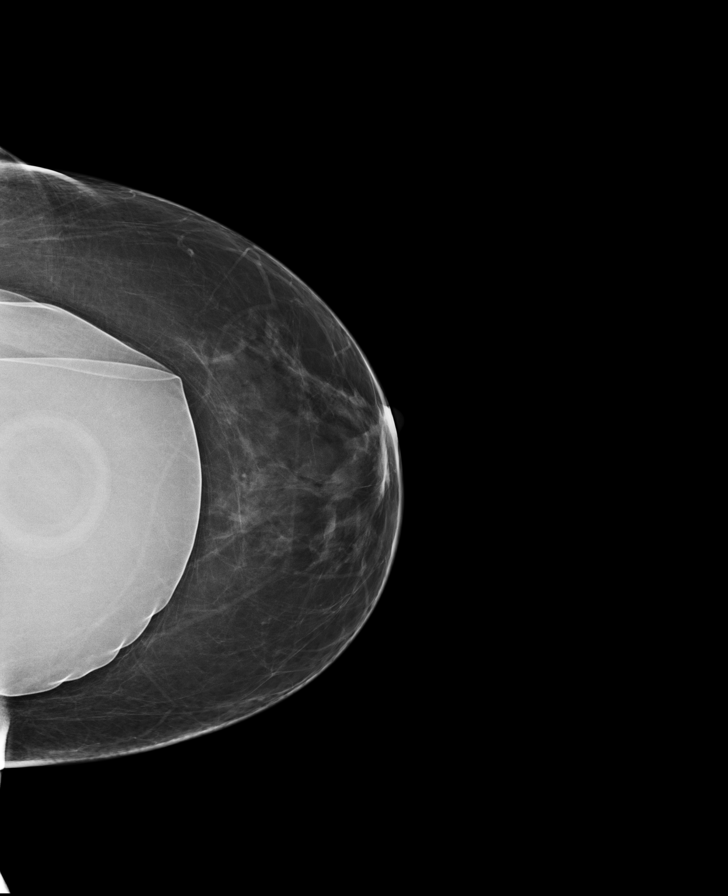

[R CC synth-2D]
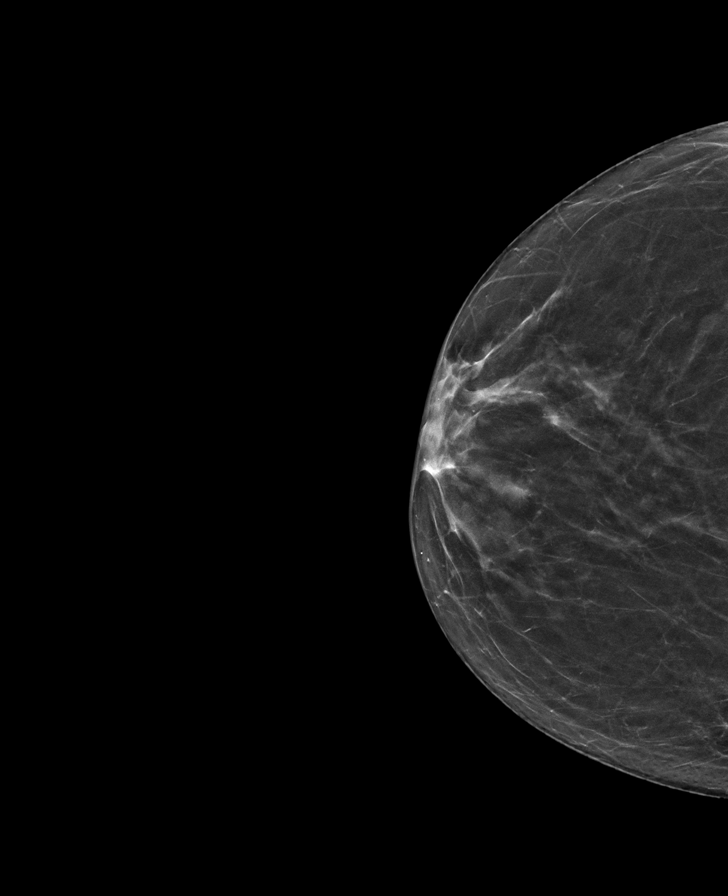

[L CC synth-2D]
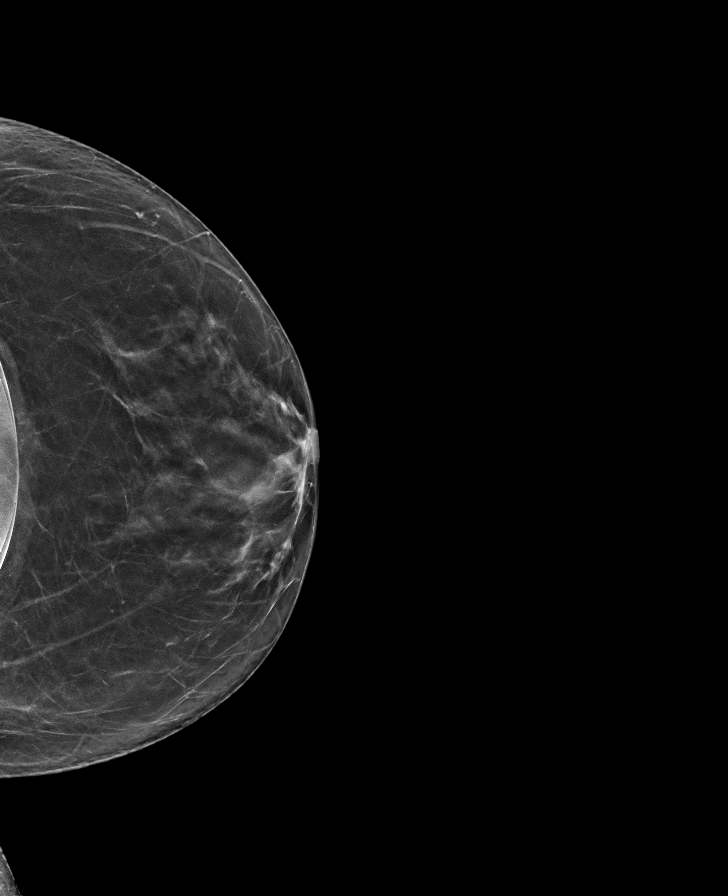

[L MLO synth-2D]
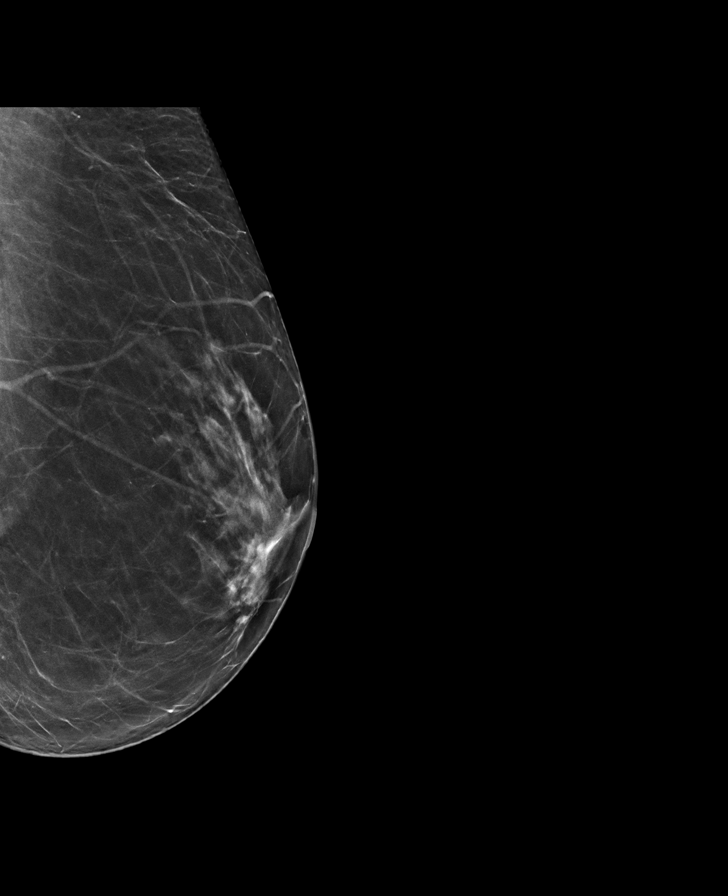

[R MLO synth-2D]
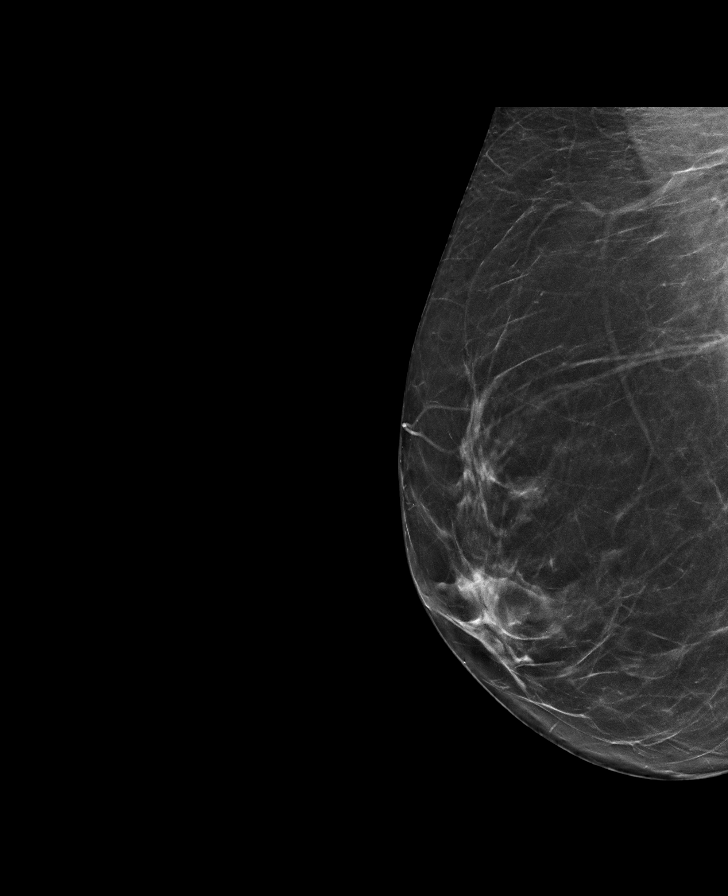

[8 of 28 positions shown; findings below may reference images not displayed]

ACR Breast Density Category b: There are scattered areas of
fibroglandular density.
FINDINGS: There are no findings suspicious for malignancy. Images were
processed with CAD.
IMPRESSION: No mammographic evidence of malignancy. A result letter of this
screening mammogram will be mailed directly to the patient.

RECOMMENDATION:
Screening mammogram in one year. (Code:WE-[id number])

BI-RADS CATEGORY  1:  Negative.

## 2022-04-18 ENCOUNTER — Other Ambulatory Visit: Payer: Self-pay | Admitting: Nurse Practitioner

## 2022-04-18 DIAGNOSIS — F5101 Primary insomnia: Secondary | ICD-10-CM

## 2022-04-20 NOTE — Telephone Encounter (Signed)
Med refill request: Ambien 10 mg tab Last AEX: 09/30/21 / TW Next AEX: Not scheduled  Last MMG (if hormonal med) N/A   Last RX sent 09/30/21 #30/1 RF  Refill authorized: Please Advise?

## 2022-05-11 ENCOUNTER — Ambulatory Visit: Payer: BC Managed Care – PPO | Admitting: Family Medicine

## 2022-05-11 ENCOUNTER — Encounter: Payer: Self-pay | Admitting: Family Medicine

## 2022-05-11 VITALS — BP 138/78 | HR 67 | Temp 98.2°F | Ht 64.0 in | Wt 147.9 lb

## 2022-05-11 DIAGNOSIS — R059 Cough, unspecified: Secondary | ICD-10-CM

## 2022-05-11 DIAGNOSIS — J029 Acute pharyngitis, unspecified: Secondary | ICD-10-CM | POA: Diagnosis not present

## 2022-05-11 DIAGNOSIS — R519 Headache, unspecified: Secondary | ICD-10-CM

## 2022-05-11 LAB — POC COVID19 BINAXNOW: SARS Coronavirus 2 Ag: NEGATIVE

## 2022-05-11 LAB — POCT RAPID STREP A (OFFICE): Rapid Strep A Screen: NEGATIVE

## 2022-05-11 MED ORDER — HYDROCOD POLI-CHLORPHE POLI ER 10-8 MG/5ML PO SUER: 5.0000 mL | Freq: Two times a day (BID) | ORAL | 0 refills | Status: DC | PRN

## 2022-05-11 NOTE — Progress Notes (Signed)
Established Patient Office Visit  Subjective   Patient ID: Colleen Martinez, female    DOB: 1960-09-14  Age: 61 y.o. MRN: [id number]  Chief Complaint  Patient presents with   Sore Throat    Patient complains of sore throat, x1 week    Nasal Congestion    X1 week   Cough    Patient complains of cough, x1 week, Non productive, Tried Iburprofen with little relief     HPI   Colleen Martinez is seen with approximately 1 week history of symptoms of sore throat, congestion, postnasal drip, cough.  Cough mostly nonproductive.  Interfering with sleep.  Colleen Martinez is unable to take many over-the-counter cough medications because they tend to agitate Colleen Martinez more.  Colleen Martinez has done well with Tussionex in the past and requesting prescription.  Denies any recent fever.  No significant facial pain.  Past Medical History:  Diagnosis Date   Abnormal Pap smear of cervix    pap 09-30-21 lgsil hpv hr +   Cervical dysplasia 2007   LGSIL   Chicken pox    Heart murmur    functional   Hx of colonic polyps 08/29/2019   Skin cancer, basal cell    Thyroid disease    Hypothyroidism   Past Surgical History:  Procedure Laterality Date   AUGMENTATION MAMMAPLASTY     BREAST SURGERY  06/07/2002   breast augmentation   GYNECOLOGIC CRYOSURGERY  06/07/2005   LGSIL normal paps since   liposuction  06/07/2002   stomach , bi-lat thighs   MOHS SURGERY N/A     reports that Colleen Martinez has never smoked. Colleen Martinez has never used smokeless tobacco. Colleen Martinez reports current alcohol use of about 2.0 standard drinks of alcohol per week. Colleen Martinez reports that Colleen Martinez does not use drugs. family history includes Arthritis in Colleen Martinez father and mother; Diabetes in Colleen Martinez sister; Hyperlipidemia in Colleen Martinez father and mother; Ovarian cancer in Colleen Martinez mother. No Known Allergies  Review of Systems  Constitutional:  Negative for chills and fever.  HENT:  Positive for congestion and sore throat.   Respiratory:  Positive for cough.       Objective:     BP 138/78 (BP Location:  Left Arm, Patient Position: Sitting, Cuff Size: Normal)   Pulse 67   Temp 98.2 F (36.8 C) (Oral)   Ht '5\' 4"'$  (1.626 m)   Wt 147 lb 14.4 oz (67.1 kg)   LMP 07/20/2015   SpO2 99%   BMI 25.39 kg/m    Physical Exam Vitals reviewed.  Constitutional:      General: Colleen Martinez is not in acute distress.    Appearance: Colleen Martinez is well-developed. Colleen Martinez is not toxic-appearing.  HENT:     Right Ear: Tympanic membrane normal.     Left Ear: Tympanic membrane normal.     Mouth/Throat:     Comments: Mild posterior pharynx erythema.  No exudate. Cardiovascular:     Rate and Rhythm: Normal rate and regular rhythm.  Pulmonary:     Effort: Pulmonary effort is normal.     Breath sounds: No wheezing or rales.  Musculoskeletal:     Cervical back: Neck supple.  Lymphadenopathy:     Cervical: No cervical adenopathy.  Neurological:     Mental Status: Colleen Martinez is alert.      Results for orders placed or performed in visit on 05/11/22  POC COVID-19  Result Value Ref Range   SARS Coronavirus 2 Ag Negative Negative  POC Rapid Strep A  Result Value Ref Range  Rapid Strep A Screen Negative Negative      The 10-year ASCVD risk score (Arnett DK, et al., 2019) is: 3%    Assessment & Plan:   Problem List Items Addressed This Visit   None Visit Diagnoses     Sore throat    -  Primary   Relevant Orders   POC Rapid Strep A (Completed)   Nonintractable headache, unspecified chronicity pattern, unspecified headache type       Relevant Orders   POC COVID-19 (Completed)   Cough, unspecified type       Relevant Orders   POC COVID-19 (Completed)     Colleen Martinez is seen with a 1 week history of respiratory symptoms as above.  Strep testing and COVID testing negative.  Suspect viral infection.  No indication for antibiotics at this time.  Cough is severe at night.  We did send refill of Tussionex 1 teaspoon every 12 hours as needed for severe cough.  Colleen Martinez is aware of potential sedation with this.  Follow-up promptly  for any fever or worsening symptoms  No follow-ups on file.    Carolann Littler, MD

## 2022-07-27 DIAGNOSIS — Z713 Dietary counseling and surveillance: Secondary | ICD-10-CM | POA: Diagnosis not present

## 2022-08-10 ENCOUNTER — Ambulatory Visit: Payer: BC Managed Care – PPO | Admitting: Nurse Practitioner

## 2022-08-10 ENCOUNTER — Encounter: Payer: Self-pay | Admitting: Nurse Practitioner

## 2022-08-10 VITALS — BP 122/80 | Ht 63.5 in | Wt 144.0 lb

## 2022-08-10 DIAGNOSIS — L292 Pruritus vulvae: Secondary | ICD-10-CM | POA: Diagnosis not present

## 2022-08-10 DIAGNOSIS — N952 Postmenopausal atrophic vaginitis: Secondary | ICD-10-CM

## 2022-08-10 LAB — WET PREP FOR TRICH, YEAST, CLUE

## 2022-08-10 MED ORDER — ESTRADIOL 0.1 MG/GM VA CREA: 1.0000 g | TOPICAL_CREAM | VAGINAL | 1 refills | Status: DC

## 2022-08-10 NOTE — Progress Notes (Signed)
   Acute Office Visit  Subjective:    Patient ID: Colleen Martinez, female    DOB: 18-Oct-1960, 62 y.o.   MRN: [id number]   HPI 62 y.o. presents today for intermittent vulvovaginal itching x 2 weeks. Symptoms are minimal today. Has not tried any OTC treatments. Painful intercourse.    Review of Systems  Constitutional: Negative.   Genitourinary:  Positive for vaginal discharge and vaginal pain (Itching). Negative for dysuria, frequency and urgency.       Objective:    Physical Exam Constitutional:      Appearance: Normal appearance.  Genitourinary:    General: Normal vulva.     Vagina: Normal.     Cervix: Normal.     Comments: Atrophic changes    BP 122/80 (BP Location: Right Arm, Patient Position: Sitting, Cuff Size: Normal)   Ht 5' 3.5" (1.613 m)   Wt 144 lb (65.3 kg)   LMP 07/20/2015   BMI 25.11 kg/m  Wt Readings from Last 3 Encounters:  08/10/22 144 lb (65.3 kg)  05/11/22 147 lb 14.4 oz (67.1 kg)  09/30/21 144 lb (65.3 kg)        Patient informed chaperone available to be present for breast and/or pelvic exam. Patient has requested no chaperone to be present. Patient has been advised what will be completed during breast and pelvic exam.   Wet prep negative for pathogens  Assessment & Plan:   Problem List Items Addressed This Visit   None Visit Diagnoses     Atrophic vaginitis    -  Primary   Relevant Medications   estradiol (ESTRACE VAGINAL) 0.1 MG/GM vaginal cream (Start on 08/12/2022)   Vulvovaginal itching       Relevant Orders   WET PREP FOR [place], YEAST, CLUE (Completed)      Plan: Negative wet prep. Exam consistent with atrophic vaginitis. Discussed OTC options and vaginal estrogen. She would like to try vaginal estrogen. Aware of risk for small amount of systemic absorption, especially with initial use. Will use nightly x 2 weeks, then twice weekly. Recommend coconut oil with intercourse. Will follow up at annual in 2 months.      Tamela Gammon DNP, 9:37 AM 08/10/2022

## 2022-08-19 ENCOUNTER — Other Ambulatory Visit: Payer: Self-pay | Admitting: *Deleted

## 2022-08-19 DIAGNOSIS — N952 Postmenopausal atrophic vaginitis: Secondary | ICD-10-CM

## 2022-08-19 MED ORDER — ESTRADIOL 0.1 MG/GM VA CREA: 1.0000 g | TOPICAL_CREAM | VAGINAL | 1 refills | Status: DC

## 2022-08-19 NOTE — Telephone Encounter (Signed)
Patient left voicemail stating [facility] contacted her about the estradiol cream and states they would not ship it to her because it needed to be on ice. Patient declined prescription at [facility] asking [facility] [place]   Medication pended for estradiol vaginal cream. Please refill if appropriate.

## 2022-08-19 NOTE — Telephone Encounter (Signed)
[facility]   Encounter closed.

## 2022-09-07 DIAGNOSIS — E039 Hypothyroidism, unspecified: Secondary | ICD-10-CM | POA: Diagnosis not present

## 2022-09-20 ENCOUNTER — Other Ambulatory Visit: Payer: Self-pay | Admitting: Family Medicine

## 2022-09-20 DIAGNOSIS — Z1231 Encounter for screening mammogram for malignant neoplasm of breast: Secondary | ICD-10-CM

## 2022-10-13 ENCOUNTER — Ambulatory Visit: Payer: BC Managed Care – PPO | Admitting: Nurse Practitioner

## 2022-10-21 ENCOUNTER — Encounter: Payer: Self-pay | Admitting: Nurse Practitioner

## 2022-10-21 ENCOUNTER — Ambulatory Visit (INDEPENDENT_AMBULATORY_CARE_PROVIDER_SITE_OTHER): Payer: BC Managed Care – PPO | Admitting: Nurse Practitioner

## 2022-10-21 ENCOUNTER — Other Ambulatory Visit (HOSPITAL_COMMUNITY)
Admission: RE | Admit: 2022-10-21 | Discharge: 2022-10-21 | Disposition: A | Discharge status: Home / Self Care | Payer: BC Managed Care – PPO | Source: Ambulatory Visit | Attending: Nurse Practitioner | Admitting: Nurse Practitioner

## 2022-10-21 VITALS — BP 118/72 | Ht 63.75 in | Wt 139.0 lb

## 2022-10-21 DIAGNOSIS — Z124 Encounter for screening for malignant neoplasm of cervix: Secondary | ICD-10-CM

## 2022-10-21 DIAGNOSIS — R87612 Low grade squamous intraepithelial lesion on cytologic smear of cervix (LGSIL): Secondary | ICD-10-CM

## 2022-10-21 DIAGNOSIS — N952 Postmenopausal atrophic vaginitis: Secondary | ICD-10-CM

## 2022-10-21 DIAGNOSIS — Z780 Asymptomatic menopausal state: Secondary | ICD-10-CM

## 2022-10-21 DIAGNOSIS — Z01419 Encounter for gynecological examination (general) (routine) without abnormal findings: Secondary | ICD-10-CM

## 2022-10-21 DIAGNOSIS — M85852 Other specified disorders of bone density and structure, left thigh: Secondary | ICD-10-CM | POA: Diagnosis not present

## 2022-10-21 MED ORDER — ESTRADIOL 0.1 MG/GM VA CREA: 1.0000 g | TOPICAL_CREAM | VAGINAL | 3 refills | Status: DC

## 2022-10-21 NOTE — Progress Notes (Signed)
Preslie Reinsch Oct 04, 1960 [id number]   History:  62 y.o. G3P0030 presents for annual exam. Postmenopausal - stopped oral HRT last year. Started vaginal estrogen in March for atrophic vaginitis. Taking OTC DHEA. 2007 cryo, 09/2018 - ASCUS + HR HPV negative biopsy, 09/2019 LGSIL + HR HPV negative biopsy, 09/2020 LGSIL + HR HPV, 09/2021 LGSIL + HR HPV neg 16/18/45 neg biopsy 12/2021 (6 month pap recommended). Hypothyroidism managed by PCP. Takes Ambien sparingly for insomnia. She is down 35 pound with diet and exercise.   Gynecologic History Patient's last menstrual period was 07/20/2015.   Contraception/Family planning: post menopausal status Sexually active: Yes  Health Maintenance Last Pap: 09/30/2021. Results were: LGSIL + HR HPV Last mammogram: 10/19/2021. Results were: Normal Last colonoscopy: 08/22/2019. Results were: Pre-cancerous polyps, 5-year recall Last Dexa: 11/17/2021. Results were: T-score  -1.1, FRAX 13% / 0.4%  Past medical history, past surgical history, family history and social history were all reviewed and documented in the EPIC chart. Married. Works in Data processing manager for [employer], travels often for work. Husband works for [employer].   ROS:  A ROS was performed and pertinent positives and negatives are included.  Exam:  Vitals:   10/21/22 0753  BP: 118/72  Weight: 139 lb (63 kg)  Height: 5' 3.75" (1.619 m)     Body mass index is 24.05 kg/m.  General appearance:  Normal Thyroid:  Symmetrical, normal in size, without palpable masses or nodularity. Respiratory  Auscultation:  Clear without wheezing or rhonchi Cardiovascular  Auscultation:  Regular rate, without rubs, murmurs or gallops  Edema/varicosities:  Not grossly evident Abdominal  Soft,nontender, without masses, guarding or rebound.  Liver/spleen:  No organomegaly noted  Hernia:  None appreciated Skin  Inspection:  Grossly normal Breasts: Examined lying and sitting. Bilateral implants noted.    Right: Without masses, retractions, nipple discharge or axillary adenopathy.   Left: Without masses, retractions, nipple discharge or axillary adenopathy. Gentitourinary   Inguinal/mons:  Normal without inguinal adenopathy  External genitalia:  Normal appearing vulva with no masses, tenderness, or lesions  BUS/Urethra/Skene's glands:  Normal  Vagina:  Normal appearing with normal color and discharge, no lesions. Atrophic changes  Cervix:  Normal appearing without discharge or lesions. Stenotic  Uterus:  Normal in size, shape and contour.  Midline and mobile, nontender  Adnexa/parametria:     Rt: Normal in size, without masses or tenderness.   Lt: Normal in size, without masses or tenderness.  Anus and perineum: Normal  Digital rectal exam: Deferred  Patient informed chaperone available to be present for breast and pelvic exam. Patient has requested no chaperone to be present. Patient has been advised what will be completed during breast and pelvic exam.   Assessment/Plan:  62 y.o. G3P0030 for annual exam.   Well female exam with routine gynecological exam - Education provided on SBEs, importance of preventative screenings, current guidelines, high calcium diet, regular exercise, and multivitamin daily. Labs with PCP.   Low grade squamous intraepithelial lesion (LGSIL) on cervical Pap smear - Plan: Cytology - PAP( [place]). 09/2018 - ASCUS + HR HPV negative biopsy, 09/2019 LGSIL + HR HPV negative biopsy, 09/2020 LGSIL + HR HPV, 09/2021 LGSIL + HR HPV neg 16/18/45 neg biopsy 12/2021 (6 month pap recommended).  Postmenopausal - stopped HRT last year, taking OTC DHEA. No bleeding.   Screening for cervical cancer - Plan: Cytology - PAP( [place])  Osteopenia of neck of left femur - T-score -1.05 November 2021. Vit D + Calcium supplement and regular  exercise recommended. Repeat DXA in 3-5 years.   Vaginal atrophy - Plan: estradiol (ESTRACE VAGINAL) 0.1 MG/GM vaginal cream twice weekly with  good management.   Screening for breast cancer - Normal mammogram history.  Continue annual screenings. Scheduled 10/26/22.  Normal breast exam today.  Screening for colon cancer - 08/2019 colonoscopy. Will repeat at 5-year interval per GI's recommendation.  Return in 1 year for annual.     Olivia Mackie DNP, 8:16 AM 10/21/2022

## 2022-10-25 ENCOUNTER — Telehealth: Payer: Self-pay

## 2022-10-25 ENCOUNTER — Other Ambulatory Visit: Payer: Self-pay | Admitting: Nurse Practitioner

## 2022-10-25 DIAGNOSIS — B9689 Other specified bacterial agents as the cause of diseases classified elsewhere: Secondary | ICD-10-CM

## 2022-10-25 LAB — CYTOLOGY - PAP
Adequacy: ABSENT
Comment: NEGATIVE
High risk HPV: POSITIVE — AB

## 2022-10-25 MED ORDER — TINIDAZOLE 500 MG PO TABS
ORAL_TABLET | ORAL | 0 refills | Status: DC
Start: 2022-10-25 — End: 2023-10-27

## 2022-10-25 MED ORDER — METRONIDAZOLE 500 MG PO TABS
500.0000 mg | ORAL_TABLET | Freq: Two times a day (BID) | ORAL | 0 refills | Status: DC
Start: 2022-10-25 — End: 2022-10-25

## 2022-10-25 NOTE — Telephone Encounter (Signed)
No answer and mailbox is full. Will try again before end of day.

## 2022-10-25 NOTE — Telephone Encounter (Signed)
Please offer Metrogel or Tinidazole. Metrogel 0.75% vaginally x 5 nights OR Tinidazole 2 grams PO daily x 2 days. Please send in whichever she prefers.

## 2022-10-25 NOTE — Telephone Encounter (Signed)
Pt opts for tinidazole rx.  Will send in rx. Pt to reach out if similar sxs occur.   Rx sent.  Will close.

## 2022-10-25 NOTE — Telephone Encounter (Signed)
Pt LVM in triage line stating that if she recalls correctly, she was prescribed flagyl last year and developed a severe case of thrush in her mouth due to it and would like an alternative if possible. Please advise.

## 2022-10-26 ENCOUNTER — Other Ambulatory Visit: Payer: Self-pay | Admitting: Family Medicine

## 2022-10-26 ENCOUNTER — Ambulatory Visit
Admission: RE | Admit: 2022-10-26 | Discharge: 2022-10-26 | Disposition: A | Discharge status: Home / Self Care | Payer: BC Managed Care – PPO | Source: Ambulatory Visit | Attending: Family Medicine | Admitting: Family Medicine

## 2022-10-26 DIAGNOSIS — Z1231 Encounter for screening mammogram for malignant neoplasm of breast: Secondary | ICD-10-CM

## 2022-12-02 DIAGNOSIS — M25531 Pain in right wrist: Secondary | ICD-10-CM | POA: Diagnosis not present

## 2022-12-15 DIAGNOSIS — E039 Hypothyroidism, unspecified: Secondary | ICD-10-CM | POA: Diagnosis not present

## 2022-12-22 DIAGNOSIS — E039 Hypothyroidism, unspecified: Secondary | ICD-10-CM | POA: Diagnosis not present

## 2022-12-22 DIAGNOSIS — E669 Obesity, unspecified: Secondary | ICD-10-CM | POA: Diagnosis not present

## 2022-12-22 DIAGNOSIS — M810 Age-related osteoporosis without current pathological fracture: Secondary | ICD-10-CM | POA: Diagnosis not present

## 2023-01-03 DIAGNOSIS — M25531 Pain in right wrist: Secondary | ICD-10-CM | POA: Diagnosis not present

## 2023-03-28 ENCOUNTER — Telehealth: Payer: Self-pay

## 2023-03-28 NOTE — Telephone Encounter (Signed)
Pt LVM in medication refill line and reported that she needed a refill on her estrogen cream and that [facility].   Rx for estrogen cream sent on 10/21/2022 for 4 tubes of cream. [facility].   Called [facility] and requested that they [facility]. They voiced understanding and stated that they will give them a call.   Called pt to notify her of what was done, no answer, LDVM on machine per DPR and advised her to contact the [facility] pharmacy either later on today or tomorrow to see where they are at with the rx transfer request and to call us back at her earliest convenience if she needed any further assistance. Will rout to provider for final review and close encounter.

## 2023-05-23 ENCOUNTER — Telehealth: Payer: BC Managed Care – PPO | Admitting: Family Medicine

## 2023-05-23 ENCOUNTER — Encounter: Payer: Self-pay | Admitting: Family Medicine

## 2023-05-23 VITALS — Temp 98.7°F

## 2023-05-23 DIAGNOSIS — U071 COVID-19: Secondary | ICD-10-CM

## 2023-05-23 MED ORDER — NIRMATRELVIR/RITONAVIR (PAXLOVID)TABLET: 3.0000 | ORAL_TABLET | Freq: Two times a day (BID) | ORAL | 0 refills | Status: AC

## 2023-05-23 MED ORDER — HYDROCOD POLI-CHLORPHE POLI ER 10-8 MG/5ML PO SUER: 5.0000 mL | Freq: Two times a day (BID) | ORAL | 0 refills | Status: DC | PRN

## 2023-05-23 NOTE — Progress Notes (Signed)
Subjective:    Patient ID: Colleen Martinez, female    DOB: August 25, 1960, 62 y.o.   MRN: [id number]  HPI Virtual Visit via Video Note  I connected with the patient on 05/23/23 at  3:45 PM EST by a video enabled telemedicine application and verified that I am speaking with the correct person using two identifiers.  Location patient: home Location provider:work or home office Persons participating in the virtual visit: patient, provider  I discussed the limitations of evaluation and management by telemedicine and the availability of in person appointments. The patient expressed understanding and agreed to proceed.   HPI: Here for a Covid infection. She returned from a trip via a plane last week, and now for the past 4 days she has had body aches, headache, fever, ST, and a dry cough. She tested positive for Covid yesterday. Drinking fluids.    ROS: See pertinent positives and negatives per HPI.  Past Medical History:  Diagnosis Date   Abnormal Pap smear of cervix    pap 09-30-21 lgsil hpv hr +   Cervical dysplasia 2007   LGSIL   Chicken pox    Heart murmur    functional   Hx of colonic polyps 08/29/2019   Skin cancer, basal cell    Thyroid disease    Hypothyroidism    Past Surgical History:  Procedure Laterality Date   AUGMENTATION MAMMAPLASTY     BREAST SURGERY  06/07/2002   breast augmentation   GYNECOLOGIC CRYOSURGERY  06/07/2005   LGSIL normal paps since   liposuction  06/07/2002   stomach , bi-lat thighs   MOHS SURGERY N/A     Family History  Problem Relation Age of Onset   Arthritis Mother    Hyperlipidemia Mother    Ovarian cancer Mother    Arthritis Father    Hyperlipidemia Father    Diabetes Sister      Current Outpatient Medications:    CALCIUM PO, Take by mouth., Disp: , Rfl:    chlorpheniramine-HYDROcodone (TUSSIONEX) 10-8 MG/5ML, Take 5 mLs by mouth every 12 (twelve) hours as needed for cough., Disp: 115 mL, Rfl: 0   Cholecalciferol (VITAMIN D  PO), Take by mouth., Disp: , Rfl:    estradiol (ESTRACE VAGINAL) 0.1 MG/GM vaginal cream, Place 1 g vaginally 2 (two) times a week., Disp: 42.5 g, Rfl: 3   levothyroxine (SYNTHROID) 88 MCG tablet, Synthroid 88 mcg tablet, Disp: , Rfl:    nirmatrelvir/ritonavir (PAXLOVID) 20 x 150 MG & 10 x 100MG  TABS, Take 3 tablets by mouth 2 (two) times daily for 5 days. (Take nirmatrelvir 150 mg two tablets twice daily for 5 days and ritonavir 100 mg one tablet twice daily for 5 days) Patient GFR is 66, Disp: 30 tablet, Rfl: 0   Omega-3 Fatty Acids (FISH OIL PO), Take by mouth., Disp: , Rfl:    Prasterone, DHEA, (DHEA PO), Take by mouth., Disp: , Rfl:    tinidazole (TINDAMAX) 500 MG tablet, Take 2 tabs po BID x 2 days, Disp: 8 tablet, Rfl: 0   zolpidem (AMBIEN) 10 MG tablet, TAKE 1 TABLET BY MOUTH AT BEDTIME AS NEEDED FOR SLEEP., Disp: 30 tablet, Rfl: 1  EXAM:  VITALS per patient if applicable:  GENERAL: alert, oriented, appears well and in no acute distress  HEENT: atraumatic, conjunttiva clear, no obvious abnormalities on inspection of external nose and ears  NECK: normal movements of the head and neck  LUNGS: on inspection no signs of respiratory distress, breathing rate appears normal,  no obvious gross SOB, gasping or wheezing  CV: no obvious cyanosis  MS: moves all visible extremities without noticeable abnormality  PSYCH/NEURO: pleasant and cooperative, no obvious depression or anxiety, speech and thought processing grossly intact  ASSESSMENT AND PLAN: Covid infection, treat with 5 days of Paxlovid. Add Tussionex as needed for cough. Gershon Crane, MD  Discussed the following assessment and plan:  No diagnosis found.     I discussed the assessment and treatment plan with the patient. The patient was provided an opportunity to ask questions and all were answered. The patient agreed with the plan and demonstrated an understanding of the instructions.   The patient was advised to call back  or seek an in-person evaluation if the symptoms worsen or if the condition fails to improve as anticipated.      Review of Systems     Objective:   Physical Exam        Assessment & Plan:

## 2023-06-02 ENCOUNTER — Telehealth: Payer: Self-pay

## 2023-06-02 NOTE — Telephone Encounter (Signed)
Copied from CRM [id number]. Topic: Clinical - Medical Advice >> Jun 02, 2023  8:12 AM Orinda Kenner C wrote: Reason for CRM: Pls advise and c/b. Pt had virtual visit with Dr. Clent Ridges 05/23/23. Pt states Covid is negative, chest congestion, bad and deep cough, phelgm greenish yellow and sometimes just clear, no sob or fever. Pt is leaving in an hour to other home and if rx needs to be sent, pls send to [id number] - [place], [place] - [id number]  Pls c/b [phone].

## 2023-06-03 ENCOUNTER — Ambulatory Visit: Payer: Self-pay | Admitting: Family Medicine

## 2023-06-03 NOTE — Telephone Encounter (Signed)
 Copied from CRM [id number]. Topic: Clinical - Medical Advice >> Jun 02, 2023  8:12 AM Orinda Kenner C wrote: Reason for CRM: Pls advise and c/b. Pt had virtual visit with Dr. Clent Ridges 05/23/23. Pt states Covid is negative, chest congestion, bad and deep cough, phelgm greenish yellow and sometimes just clear, no sob or fever. Pt is leaving in an hour to other home and if rx needs to be sent, pls send to [id number] - [place], [place] - [address]  Pls c/b [phone].

## 2023-06-03 NOTE — Telephone Encounter (Signed)
Copied from CRM [id number]. Topic: Clinical - Medical Advice >> Jun 02, 2023  8:12 AM Orinda Kenner C wrote: Reason for CRM: Pls advise and c/b. Pt had virtual visit with Dr. Clent Ridges 05/23/23. Pt states Covid is negative, chest congestion, bad and deep cough, phelgm greenish yellow and sometimes just clear, no sob or fever. Pt is leaving in an hour to other home and if rx needs to be sent, pls send to [id number] - [place], [place] - [address]  Pls c/b [phone].   Chief Complaint: Cough Symptoms: Cough, green/yellow sputum production, chest congestion  Frequency: Frequent  Pertinent Negatives: Patient denies fever, shortness of breath  Disposition: [] ED /[] Urgent Care (no appt availability in office) / [x] Appointment(In office/virtual)/ []  [facility]/ [] Home Care/ [] Refused Recommended Disposition /[]  Mobile Bus/ []  Follow-up with PCP Additional Notes: Patient reports that she was recently diagnosed with COVID. She states that for the last few days her cough has been worsening and wanted to see if her provider could give her a prescription for medication to help with her symptoms. She states that she is currently using Tussionex that was prescribed to her that is providing some relief.    Reason for Disposition  Cough has been present for > 3 weeks  Answer Assessment - Initial Assessment Questions 1. ONSET: "When did the cough begin?"      5 days ago 2. SEVERITY: "How bad is the cough today?"      Severity changes 3. SPUTUM: "Describe the color of your sputum" (none, dry cough; clear, white, yellow, green)     Green/yellow 4. HEMOPTYSIS: "Are you coughing up any blood?" If so ask: "How much?" (flecks, streaks, tablespoons, etc.)     No 5. DIFFICULTY BREATHING: "Are you having difficulty breathing?" If Yes, ask: "How bad is it?" (e.g., mild, moderate, severe)    - MILD: No SOB at rest, mild SOB with walking, speaks normally in sentences, can lie  down, no retractions, pulse < 100.    - MODERATE: SOB at rest, SOB with minimal exertion and prefers to sit, cannot lie down flat, speaks in phrases, mild retractions, audible wheezing, pulse 100-120.    - SEVERE: Very SOB at rest, speaks in single words, struggling to breathe, sitting hunched forward, retractions, pulse > 120      No 6. FEVER: "Do you have a fever?" If Yes, ask: "What is your temperature, how was it measured, and when did it start?"     No 7. CARDIAC HISTORY: "Do you have any history of heart disease?" (e.g., heart attack, congestive heart failure)      Heart murmur 8. LUNG HISTORY: "Do you have any history of lung disease?"  (e.g., pulmonary embolus, asthma, emphysema)     No 9. PE RISK FACTORS: "Do you have a history of blood clots?" (or: recent major surgery, recent prolonged travel, bedridden)     No 10. OTHER SYMPTOMS: "Do you have any other symptoms?" (e.g., runny nose, wheezing, chest pain)       Intermittent clear rhinorrhea 11. PREGNANCY: "Is there any chance you are pregnant?" "When was your last menstrual period?"       No 12. TRAVEL: "Have you traveled out of the country in the last month?" (e.g., travel history, exposures)       Yes, in [place] 3 weeks ago  Protocols used: Cough - Acute Productive-A-AH

## 2023-06-06 ENCOUNTER — Encounter: Payer: Self-pay | Admitting: Family Medicine

## 2023-06-06 ENCOUNTER — Ambulatory Visit: Payer: BC Managed Care – PPO | Admitting: Family Medicine

## 2023-06-06 VITALS — BP 146/84 | HR 64 | Temp 97.8°F | Wt 141.3 lb

## 2023-06-06 DIAGNOSIS — R051 Acute cough: Secondary | ICD-10-CM | POA: Diagnosis not present

## 2023-06-06 DIAGNOSIS — J209 Acute bronchitis, unspecified: Secondary | ICD-10-CM | POA: Diagnosis not present

## 2023-06-06 MED ORDER — BENZONATATE 100 MG PO CAPS: 100.0000 mg | ORAL_CAPSULE | Freq: Three times a day (TID) | ORAL | 0 refills | Status: DC | PRN

## 2023-06-06 NOTE — Progress Notes (Signed)
Established Patient Office Visit  Subjective   Patient ID: Colleen Martinez, female    DOB: [birth year]  Age: 62 y.o. MRN: [id number]  Chief Complaint  Patient presents with   Cough    Patient complains of cough, x2 weeks, Tried Mucinex and Hydrocodone cough syrup, Productive cough with greenish-yellow sputum     HPI   Colleen Martinez seen with productive cough for couple weeks.  She states that she and her husband were traveling internationally and on the 15th after coming back from their trip tested positive for COVID.  She had home test on the 26 first which was negative.  She has developed some persistent productive cough with green-yellow sputum.  Has taken over-the-counter Mucinex.  She was treated with Paxlovid at the time of her diagnosis of COVID back in mid December.  Has had no fever.  No wheezing.  No dyspnea.  Non-smoker.  No chronic lung problems.  Past Medical History:  Diagnosis Date   Abnormal Pap smear of cervix    pap 09-30-21 lgsil hpv hr +   Cervical dysplasia 2007   LGSIL   Chicken pox    Heart murmur    functional   Hx of colonic polyps 08/29/2019   Skin cancer, basal cell    Thyroid disease    Hypothyroidism   Past Surgical History:  Procedure Laterality Date   AUGMENTATION MAMMAPLASTY     BREAST SURGERY  06/07/2002   breast augmentation   GYNECOLOGIC CRYOSURGERY  06/07/2005   LGSIL normal paps since   liposuction  06/07/2002   stomach , bi-lat thighs   MOHS SURGERY N/A     reports that she has never smoked. She has never been exposed to tobacco smoke. She has never used smokeless tobacco. She reports current alcohol use of about 2.0 standard drinks of alcohol per week. She reports that she does not use drugs. family history includes Arthritis in her father and mother; Diabetes in her sister; Hyperlipidemia in her father and mother; Ovarian cancer in her mother. No Known Allergies  Review of Systems  Constitutional:  Negative for chills and fever.   HENT:  Negative for sore throat.   Respiratory:  Positive for cough and sputum production. Negative for hemoptysis, shortness of breath and wheezing.   Cardiovascular:  Negative for chest pain.      Objective:     BP (!) 146/84 (BP Location: Left Arm, Patient Position: Sitting, Cuff Size: Normal)   Pulse 64   Temp 97.8 F (36.6 C) (Oral)   Wt 141 lb 4.8 oz (64.1 kg)   LMP 07/20/2015   SpO2 98%   BMI 24.44 kg/m  BP Readings from Last 3 Encounters:  06/06/23 (!) 146/84  10/21/22 118/72  08/10/22 122/80   Wt Readings from Last 3 Encounters:  06/06/23 141 lb 4.8 oz (64.1 kg)  10/21/22 139 lb (63 kg)  08/10/22 144 lb (65.3 kg)      Physical Exam Vitals reviewed.  Constitutional:      General: She is not in acute distress.    Appearance: She is not ill-appearing or toxic-appearing.  HENT:     Right Ear: Tympanic membrane normal.     Left Ear: Tympanic membrane normal.  Cardiovascular:     Rate and Rhythm: Normal rate and regular rhythm.  Pulmonary:     Effort: Pulmonary effort is normal.     Breath sounds: Normal breath sounds. No wheezing or rales.  Neurological:     Mental Status: She is  alert.      No results found for any visits on 06/06/23.    The 10-year ASCVD risk score (Arnett DK, et al., 2019) is: 3.8%    Assessment & Plan:   Productive cough.  Patient had COVID back in mid December and seen to be recovering from that but now has productive cough.  Nonfocal exam.  Does not have any red flag such as dyspnea, hypoxia, fever. -Continue Mucinex -Tessalon Perles 100 mg every 8 hours as needed for cough -Follow-up promptly for any fever or increased shortness of breath -Touch base if cough not resolving next couple of weeks  Evelena Peat, MD

## 2023-09-05 ENCOUNTER — Ambulatory Visit: Admitting: Family Medicine

## 2023-09-05 ENCOUNTER — Encounter: Payer: Self-pay | Admitting: Family Medicine

## 2023-09-05 VITALS — BP 138/80 | HR 67 | Temp 97.9°F | Wt 140.0 lb

## 2023-09-05 DIAGNOSIS — J0100 Acute maxillary sinusitis, unspecified: Secondary | ICD-10-CM | POA: Diagnosis not present

## 2023-09-05 MED ORDER — FLUCONAZOLE 150 MG PO TABS: 150.0000 mg | ORAL_TABLET | Freq: Once | ORAL | 0 refills | Status: AC

## 2023-09-05 MED ORDER — AMOXICILLIN-POT CLAVULANATE 875-125 MG PO TABS
1.0000 | ORAL_TABLET | Freq: Two times a day (BID) | ORAL | 0 refills | Status: DC
Start: 2023-09-05 — End: 2023-10-27

## 2023-09-05 NOTE — Progress Notes (Signed)
 Established Patient Office Visit  Subjective   Patient ID: Colleen Martinez, female    DOB: May 18, 1961  Age: 63 y.o. MRN: [id number]  Chief Complaint  Patient presents with   Sinusitis    Patient complains of sinusitis, x4 days, Patient reports yellow discharge    HPI   Colleen Martinez is seen today with onset last Thursday of sore throat and bodyaches.  Minimal cough.  She has had some progressive maxillary sinus pressure and bloody and occasionally purulent discharge.  No fevers or chills.  No known sick contacts.  Has had some malaise.  Past Medical History:  Diagnosis Date   Abnormal Pap smear of cervix    pap 09-30-21 lgsil hpv hr +   Cervical dysplasia 2007   LGSIL   Chicken pox    Heart murmur    functional   Hx of colonic polyps 08/29/2019   Skin cancer, basal cell    Thyroid disease    Hypothyroidism   Past Surgical History:  Procedure Laterality Date   AUGMENTATION MAMMAPLASTY     BREAST SURGERY  06/07/2002   breast augmentation   GYNECOLOGIC CRYOSURGERY  06/07/2005   LGSIL normal paps since   liposuction  06/07/2002   stomach , bi-lat thighs   MOHS SURGERY N/A     reports that she has never smoked. She has never been exposed to tobacco smoke. She has never used smokeless tobacco. She reports current alcohol use of about 2.0 standard drinks of alcohol per week. She reports that she does not use drugs. family history includes Arthritis in her father and mother; Diabetes in her sister; Hyperlipidemia in her father and mother; Ovarian cancer in her mother. No Known Allergies  Review of Systems  Constitutional:  Negative for chills and fever.  HENT:  Positive for sinus pain. Negative for ear pain.   Respiratory:  Negative for wheezing.   Neurological:  Positive for headaches.      Objective:     BP 138/80 (BP Location: Left Arm, Patient Position: Sitting, Cuff Size: Normal)   Pulse 67   Temp 97.9 F (36.6 C) (Oral)   Wt 140 lb (63.5 kg)   LMP 07/20/2015    SpO2 97%   BMI 24.22 kg/m  BP Readings from Last 3 Encounters:  09/05/23 138/80  06/06/23 (!) 146/84  10/21/22 118/72   Wt Readings from Last 3 Encounters:  09/05/23 140 lb (63.5 kg)  06/06/23 141 lb 4.8 oz (64.1 kg)  10/21/22 139 lb (63 kg)      Physical Exam Vitals reviewed.  Constitutional:      General: She is not in acute distress.    Appearance: She is not ill-appearing.  HENT:     Right Ear: Tympanic membrane normal.     Left Ear: Tympanic membrane normal.     Mouth/Throat:     Mouth: Mucous membranes are moist.     Pharynx: Oropharynx is clear. No oropharyngeal exudate or posterior oropharyngeal erythema.  Cardiovascular:     Rate and Rhythm: Normal rate and regular rhythm.  Pulmonary:     Effort: Pulmonary effort is normal.     Breath sounds: Normal breath sounds. No wheezing or rales.      No results found for any visits on 09/05/23.    The 10-year ASCVD risk score (Arnett DK, et al., 2019) is: 3.4%    Assessment & Plan:   acute sinusitis.  We discussed the fact that most sinusitis is viral but she has had some  progressive bloody purulent nasal discharge and progressive headaches and maxillary pain bilaterally.  We decided to go to start Augmentin 875 mg twice daily for 7 days.  Plenty fluids.  Follow-up for any persistent or worsening symptoms  Colleen Peat, MD

## 2023-09-23 ENCOUNTER — Ambulatory Visit (INDEPENDENT_AMBULATORY_CARE_PROVIDER_SITE_OTHER)

## 2023-09-23 ENCOUNTER — Ambulatory Visit: Payer: Self-pay

## 2023-09-23 ENCOUNTER — Ambulatory Visit
Admission: EM | Admit: 2023-09-23 | Discharge: 2023-09-23 | Disposition: A | Discharge status: Home / Self Care | Attending: Family Medicine | Admitting: Family Medicine

## 2023-09-23 DIAGNOSIS — R109 Unspecified abdominal pain: Secondary | ICD-10-CM | POA: Diagnosis not present

## 2023-09-23 DIAGNOSIS — K5900 Constipation, unspecified: Secondary | ICD-10-CM | POA: Diagnosis not present

## 2023-09-23 LAB — POCT URINALYSIS DIP (MANUAL ENTRY)
Bilirubin, UA: NEGATIVE
Glucose, UA: NEGATIVE mg/dL
Ketones, POC UA: NEGATIVE mg/dL
Leukocytes, UA: NEGATIVE
Nitrite, UA: NEGATIVE
Protein Ur, POC: NEGATIVE mg/dL
Spec Grav, UA: 1.01 (ref 1.010–1.025)
Urobilinogen, UA: 0.2 U/dL
pH, UA: 6 (ref 5.0–8.0)

## 2023-09-23 NOTE — ED Triage Notes (Signed)
 Pt presents to uc with co of left flank pain since last Wednesday. No dysuria.

## 2023-09-23 NOTE — Telephone Encounter (Signed)
 Chief Complaint: abd pain Symptoms: L-sided abd/torso pain Frequency: 1 month, worsening since Wednesday Pertinent Negatives: Patient denies fever, N/V/D, constipation, CP, SOB, rectal bleeding Disposition: [] ED /[x] Urgent Care (no appt availability in office) / [] Appointment(In office/virtual)/ []  [facility]/ [] Home Care/ [] Refused Recommended Disposition /[] [facility]/ []  Follow-up with PCP Additional Notes: pt reports abd pain for 1 month with worsening since Wednesday. Pt reports Wednesday she ate middle-eastern food for lunch and after that the pain got worse. Pt endorses 5/10 pain today, states it is better than it was. Pt denies ever having nausea, vomiting, diarrhea, or constipation. Pain is worse with movement. Pain is localized to an area on her L side, pt denies swelling, bloating, or unusual external appearance. Pt denies CP, SOB, fever, weakness. RN advised pt should be seen today within 4 hours, her office is closed for the holiday. RN advised pt go to UC and gave pt the address and wait time for the closest UC. Pt states she will head there now.   Copied from CRM [id number]. Topic: Clinical - Red Word Triage >> Sep 23, 2023 10:02 AM Alethia Huxley E wrote: [facility] that prompted transfer to Nurse Triage: Increasing pain. Patient stated she is having some tenderness on the left side of her abdominal region. Has been a gradual onset, but noticed within the past 2-3 days it has become more noticeable. Patient rated the pain at level 5 out of 10, when she moves a certain way it hurts more. Reason for Disposition  [1] MILD-MODERATE pain AND [2] constant AND [3] present > 2 hours  Answer Assessment - Initial Assessment Questions 1. LOCATION: "Where does it hurt?"      L-sided pain in line with her belly button, pt states for lunch on Wednesday she had middle-eastern food and states her stomach was upset afterward. Denies nausea or diarrhea at that time.  2. RADIATION:  "Does the pain shoot anywhere else?" (e.g., chest, back)     Denies, states it is localized 3. ONSET: "When did the pain begin?" (e.g., minutes, hours or days ago)      Pt states for the last month or so her L side has been "barely touchy", pt states since lunch on Wednesday it feels more "inflamed", it's a very focused area  4. SUDDEN: "Gradual or sudden onset?"     Some pain for 1 month, worse on Wednesday, got better but still there  5. PATTERN "Does the pain come and go, or is it constant?"    - If it comes and goes: "How long does it last?" "Do you have pain now?"     (Note: Comes and goes means the pain is intermittent. It goes away completely between bouts.)    - If constant: "Is it getting better, staying the same, or getting worse?"      (Note: Constant means the pain never goes away completely; most serious pain is constant and gets worse.)      States L-side is tender to the touch. Pt states if she touches or presses on it its a 5/10. If she is sitting still and not moving she doesn't notice it, once she is up and moving its painful. 6. SEVERITY: "How bad is the pain?"  (e.g., Scale 1-10; mild, moderate, or severe)    - MILD (1-3): Doesn't interfere with normal activities, abdomen soft and not tender to touch.     - MODERATE (4-7): Interferes with normal activities or awakens from sleep, abdomen  tender to touch.     - SEVERE (8-10): Excruciating pain, doubled over, unable to do any normal activities.       5/10 7. RECURRENT SYMPTOM: "Have you ever had this type of stomach pain before?" If Yes, ask: "When was the last time?" and "What happened that time?"      Ongoing for 1 month, hardly noticeable until the last 2 days 8. CAUSE: "What do you think is causing the stomach pain?"     Pt not sure  9. RELIEVING/AGGRAVATING FACTORS: "What makes it better or worse?" (e.g., antacids, bending or twisting motion, bowel movement)     Worse with movement 10. OTHER SYMPTOMS: "Do you have any  other symptoms?" (e.g., back pain, diarrhea, fever, urination pain, vomiting)       Denies diarrhea, vomiting, nausea. Denies fever or chills. Denies back or CP. Denies urinary symptoms. Denies bloating or swelling on the L side of her abd where pain is. Endorses regular Bms. Denies rectal bleeding. Denies black tarry stools.  Protocols used: Abdominal Pain - Female-A-AH

## 2023-09-23 NOTE — Discharge Instructions (Signed)
 Increase your fiber intake and you may use over-the-counter laxative such as MiraLAX as needed.  Please follow-up with your PCP if your symptoms do not improve as you may need additional imaging of your abdomen.  Please go to the ER if you develop any worsening symptoms.  Hope you feel better soon!

## 2023-09-23 NOTE — ED Provider Notes (Signed)
 [facility]    CSN: [id number] Arrival date & time: 09/23/23  1037      History   Chief Complaint Chief Complaint  Patient presents with   Flank Pain    HPI Colleen Martinez is a 63 y.o. female presents with flank pain.  Patient reports over the past 2 months she has noticed some "discomfort" to her left mid lateral side.  States if she is laying on her right side in bed and her husband (arm around her waist she feels like it is uncomfortable due to the pressure but no pain.  Two days ago she ate some lunch that did not agree with her and had some GI upset but denies any nausea vomiting or diarrhea.  States it seemed to have worsened that side pain.  It is intermittent and occurs primarily with palpation and movement.  Denies any injury or known inciting event.  No dysuria, hematuria, flank pain, or abdominal pain.  States she does do a lot of working out lifting weights and this does not feel like a strained muscle to her.  She took some Pepto-Bismol 2 days ago after her disagreeable lunch and she felt like that also helped her side pain.  Denies any history of GI diagnoses such as Crohn's, IBS, colitis, kidney stones.  No abdominal surgeries.  No other concerns at this time.   Flank Pain    Past Medical History:  Diagnosis Date   Abnormal Pap smear of cervix    pap 09-30-21 lgsil hpv hr +   Cervical dysplasia 2007   LGSIL   Chicken pox    Heart murmur    functional   Hx of colonic polyps 08/29/2019   Skin cancer, basal cell    Thyroid  disease    Hypothyroidism    Patient Active Problem List   Diagnosis Date Noted   Hx of colonic polyps 08/29/2019   Insomnia 09/13/2018    Past Surgical History:  Procedure Laterality Date   AUGMENTATION MAMMAPLASTY     BREAST SURGERY  06/07/2002   breast augmentation   GYNECOLOGIC CRYOSURGERY  06/07/2005   LGSIL normal paps since   liposuction  06/07/2002   stomach , bi-lat thighs   MOHS SURGERY N/A     OB History      Gravida  3   Para      Term      Preterm      AB  3   Living  0      SAB  3   IAB      Ectopic      Multiple      Live Births               Home Medications    Prior to Admission medications   Medication Sig Start Date End Date Taking? Authorizing Provider  amoxicillin -clavulanate (AUGMENTIN ) 875-125 MG tablet Take 1 tablet by mouth 2 (two) times daily. 09/05/23   Burchette, Marijean Shouts, MD  benzonatate  (TESSALON  PERLES) 100 MG capsule Take 1 capsule (100 mg total) by mouth 3 (three) times daily as needed. 06/06/23   Burchette, Marijean Shouts, MD  CALCIUM PO Take by mouth.    [provider]  chlorpheniramine-HYDROcodone  (TUSSIONEX) 10-8 MG/5ML Take 5 mLs by mouth every 12 (twelve) hours as needed for cough. 05/23/23   Donley Furth, MD  Cholecalciferol (VITAMIN D  PO) Take by mouth.    [provider]  estradiol  (ESTRACE  VAGINAL) 0.1 MG/GM vaginal cream Place  1 g vaginally 2 (two) times a week. 10/21/22   Andee Bamberger, NP  levothyroxine (SYNTHROID) 88 MCG tablet Synthroid 88 mcg tablet    [provider]  Omega-3 Fatty Acids (FISH OIL PO) Take by mouth.    [provider]  Prasterone, DHEA, (DHEA PO) Take by mouth.    [provider]  tinidazole  (TINDAMAX ) 500 MG tablet Take 2 tabs po BID x 2 days 10/25/22   Antonio Klinefelter A, NP  zolpidem  (AMBIEN ) 10 MG tablet TAKE 1 TABLET BY MOUTH AT BEDTIME AS NEEDED FOR SLEEP. 04/20/22   Antonio Klinefelter A, NP  progesterone  (PROMETRIUM ) 200 MG capsule Take 1 capsule (200 mg total) by mouth daily. Day 1-12 of each month 07/19/17 08/02/17  Freddi Jaeger, NP    Family History Family History  Problem Relation Age of Onset   Arthritis Mother    Hyperlipidemia Mother    Ovarian cancer Mother    Arthritis Father    Hyperlipidemia Father    Diabetes Sister     Social History Social History   Tobacco Use   Smoking status: Never    Passive exposure: Never   Smokeless tobacco: Never   Vaping Use   Vaping status: Never Used  Substance Use Topics   Alcohol use: Yes    Alcohol/week: 2.0 standard drinks of alcohol    Types: 2 Standard drinks or equivalent per week    Comment: social   Drug use: No     Allergies   Patient has no known allergies.   Review of Systems Review of Systems  Gastrointestinal:        Left mid side pain      Physical Exam Triage Vital Signs ED Triage Vitals  Encounter Vitals Group     BP 09/23/23 1055 (!) 149/86     Systolic BP Percentile --      Diastolic BP Percentile --      Pulse Rate 09/23/23 1055 71     Resp 09/23/23 1055 16     Temp 09/23/23 1055 97.7 F (36.5 C)     Temp src --      SpO2 09/23/23 1055 98 %     Weight --      Height --      Head Circumference --      Peak Flow --      Pain Score 09/23/23 1053 0     Pain Loc --      Pain Education --      Exclude from Growth Chart --    No data found.  Updated Vital Signs BP (!) 149/86   Pulse 71   Temp 97.7 F (36.5 C)   Resp 16   LMP 07/20/2015   SpO2 98%   Visual Acuity Right Eye Distance:   Left Eye Distance:   Bilateral Distance:    Right Eye Near:   Left Eye Near:    Bilateral Near:     Physical Exam Vitals and nursing note reviewed.  Constitutional:      General: She is not in acute distress.    Appearance: Normal appearance. She is not ill-appearing.  HENT:     Head: Normocephalic and atraumatic.  Eyes:     Pupils: Pupils are equal, round, and reactive to light.  Cardiovascular:     Rate and Rhythm: Normal rate.  Pulmonary:     Effort: Pulmonary effort is normal.  Abdominal:     General: Bowel sounds are normal.  There is no distension.     Palpations: There is no hepatomegaly or splenomegaly.     Tenderness: There is no right CVA tenderness or left CVA tenderness.       Comments: There is mild tenderness with palpation to the left lateral abdomen/side.  There is no swelling, erythema, ecchymosis.  There is no tenderness to the  left upper lower quadrant or left flank.  Skin:    General: Skin is warm and dry.  Neurological:     General: No focal deficit present.     Mental Status: She is alert and oriented to person, place, and time.  Psychiatric:        Mood and Affect: Mood normal.        Behavior: Behavior normal.      UC Treatments / Results  Labs (all labs ordered are listed, but only abnormal results are displayed) Labs Reviewed  POCT URINALYSIS DIP (MANUAL ENTRY) - Abnormal; Notable for the following components:      Result Value   Color, UA light yellow (*)    Blood, UA trace-lysed (*)    All other components within normal limits    EKG   Radiology DG Abdomen 1 View Result Date: 09/23/2023 CLINICAL DATA:  left lateral side pain. EXAM: ABDOMEN - 1 VIEW COMPARISON:  None Available. FINDINGS: No abnormal calcification noted overlying bilateral kidneys, ureters and urinary bladder region. The bowel gas pattern is non-obstructive. There is small-to-moderate stool burden, compatible with colonic hypomotility. No evidence of pneumoperitoneum, within the limitations of a supine film. No acute osseous abnormalities. The soft tissues are within normal limits. Surgical changes, devices, tubes and lines: None. Other findings: None. IMPRESSION: Nonobstructive bowel gas pattern. Small-to-moderate stool burden, compatible with colonic hypomotility. Electronically Signed   By: Beula Brunswick M.D.   On: 09/23/2023 11:35    Procedures Procedures (including critical care time)  Medications Ordered in UC Medications - No data to display  Initial Impression / Assessment and Plan / UC Course  I have reviewed the triage vital signs and the nursing notes.  Pertinent labs & imaging results that were available during my care of the patient were reviewed by me and considered in my medical decision making (see chart for details).     Reviewed exam and symptoms with patient.  No red flags.  Urine with trace blood  otherwise unremarkable.  KUB without any calcification but does show nonobstructive bowel gas pattern with moderate stool burden.  Patient does report daily BMs.  Did review increasing fiber, fluids and possible over-the-counter laxative such as MiraLAX.  Advise follow-up with PCP if her symptoms do not improve as she may need additional imaging.  ER precautions reviewed and patient verbalized understanding. Final Clinical Impressions(s) / UC Diagnoses   Final diagnoses:  Side pain  Constipation, unspecified constipation type     Discharge Instructions      Increase your fiber intake and you may use over-the-counter laxative such as MiraLAX as needed.  Please follow-up with your PCP if your symptoms do not improve as you may need additional imaging of your abdomen.  Please go to the ER if you develop any worsening symptoms.  Hope you feel better soon!     ED Prescriptions   None    PDMP not reviewed this encounter.   Alleen Arbour, NP 09/23/23 1146

## 2023-09-30 ENCOUNTER — Other Ambulatory Visit: Payer: Self-pay | Admitting: Family Medicine

## 2023-09-30 DIAGNOSIS — Z0000 Encounter for general adult medical examination without abnormal findings: Secondary | ICD-10-CM

## 2023-10-27 ENCOUNTER — Other Ambulatory Visit (HOSPITAL_COMMUNITY)
Admission: RE | Admit: 2023-10-27 | Discharge: 2023-10-27 | Disposition: A | Discharge status: Home / Self Care | Source: Ambulatory Visit | Attending: Nurse Practitioner | Admitting: Nurse Practitioner

## 2023-10-27 ENCOUNTER — Encounter: Payer: Self-pay | Admitting: Nurse Practitioner

## 2023-10-27 ENCOUNTER — Ambulatory Visit (INDEPENDENT_AMBULATORY_CARE_PROVIDER_SITE_OTHER): Admitting: Nurse Practitioner

## 2023-10-27 VITALS — BP 116/64 | HR 64 | Ht 63.5 in | Wt 141.6 lb

## 2023-10-27 DIAGNOSIS — M85852 Other specified disorders of bone density and structure, left thigh: Secondary | ICD-10-CM | POA: Diagnosis not present

## 2023-10-27 DIAGNOSIS — R87612 Low grade squamous intraepithelial lesion on cytologic smear of cervix (LGSIL): Secondary | ICD-10-CM | POA: Diagnosis not present

## 2023-10-27 DIAGNOSIS — Z01419 Encounter for gynecological examination (general) (routine) without abnormal findings: Secondary | ICD-10-CM | POA: Diagnosis not present

## 2023-10-27 DIAGNOSIS — Z1331 Encounter for screening for depression: Secondary | ICD-10-CM

## 2023-10-27 DIAGNOSIS — N952 Postmenopausal atrophic vaginitis: Secondary | ICD-10-CM

## 2023-10-27 DIAGNOSIS — F5101 Primary insomnia: Secondary | ICD-10-CM

## 2023-10-27 DIAGNOSIS — Z780 Asymptomatic menopausal state: Secondary | ICD-10-CM

## 2023-10-27 MED ORDER — ZOLPIDEM TARTRATE 10 MG PO TABS: 10.0000 mg | ORAL_TABLET | Freq: Every evening | ORAL | 1 refills | Status: AC | PRN

## 2023-10-27 MED ORDER — ESTRADIOL 0.1 MG/GM VA CREA
1.0000 g | TOPICAL_CREAM | VAGINAL | 3 refills | Status: DC
Start: 2023-10-27 — End: 2023-11-30

## 2023-10-27 NOTE — Progress Notes (Addendum)
 Colleen Martinez Oct 04, 1960 [id number]   History:  63 y.o. G3P0030 presents for annual exam. Postmenopausal - stopped systemic HRT a couple of years ago. Using vaginal estrogen. Taking OTC DHEA. 2007 cryo, See pap history below. Hypothyroidism managed by PCP. Takes Ambien  sparingly for insomnia. She is down 35 pound with diet and exercise.   09/2018 ASCUS + HR HPV negative biopsy 09/2019 LGSIL + HR HPV negative biopsy 09/2020 LGSIL + HR HPV 09/2021 LGSIL + HR HPV neg 16/18/45 neg biopsy 10/21/2022 LGSIL + HR HPV  Gynecologic History Patient's last menstrual period was 07/20/2015.   Contraception/Family planning: post menopausal status Sexually active: Yes  Health Maintenance Last Pap: 10/21/2022. Results were: LGSIL +HR HPV Last mammogram: 10/26/2022. Results were: Normal Last colonoscopy: 08/22/2019. Results were: Pre-cancerous polyps, 5-year recall Last Dexa: 11/17/2021. Results were: T-score  -1.1, FRAX 13% / 0.4%     10/27/2023    8:06 AM  Depression screen PHQ 2/9  Decreased Interest 0  Down, Depressed, Hopeless 0  PHQ - 2 Score 0     Past medical history, past surgical history, family history and social history were all reviewed and documented in the EPIC chart. Married. Works in Data processing manager for [employer], travels often for work. Husband just retired from [employer].   ROS:  A ROS was performed and pertinent positives and negatives are included.  Exam:  Vitals:   10/27/23 0805  BP: 116/64  Pulse: 64  SpO2: 96%  Weight: 141 lb 9.6 oz (64.2 kg)  Height: 5' 3.5" (1.613 m)    Body mass index is 24.69 kg/m.  General appearance:  Normal Thyroid :  Symmetrical, normal in size, without palpable masses or nodularity. Respiratory  Auscultation:  Clear without wheezing or rhonchi Cardiovascular  Auscultation:  Regular rate, without rubs, murmurs or gallops  Edema/varicosities:  Not grossly evident Abdominal  Soft,nontender, without masses, guarding or  rebound.  Liver/spleen:  No organomegaly noted  Hernia:  None appreciated Skin  Inspection:  Grossly normal Breasts: Examined lying and sitting. Bilateral implants noted.   Right: Without masses, retractions, nipple discharge or axillary adenopathy.   Left: Without masses, retractions, nipple discharge or axillary adenopathy. Pelvic: External genitalia:  no lesions              Urethra:  normal appearing urethra with no masses, tenderness or lesions              Bartholins and Skenes: normal                 Vagina: normal appearing vagina with normal color and discharge, no lesions. Atrophic changes              Cervix: no lesions, stenosis Bimanual Exam:  Uterus:  no masses or tenderness              Adnexa: no mass, fullness, tenderness              Rectovaginal: Deferred              Anus:  normal, no lesions  Doria Garden, NP student present as chaperone.   Assessment/Plan:  63 y.o. G3P0030 for annual exam.   Well female exam with routine gynecological exam - Education provided on SBEs, importance of preventative screenings, current guidelines, high calcium diet, regular exercise, and multivitamin daily. Labs with PCP.   Low grade squamous intraepithelial lesion (LGSIL) on cervical Pap smear - Plan: Cytology - PAP( [place]). See HPI for pap history. Pap today per  guidelines.   Postmenopausal - stopped HRT a couple of years ago, taking OTC DHEA. No bleeding.   Osteopenia of neck of left femur - T-score -1.05 November 2021. Vit D + Calcium supplement and regular exercise recommended. Repeat DXA in 3-5 years.   Vaginal atrophy - Plan: estradiol  (ESTRACE  VAGINAL) 0.1 MG/GM vaginal cream twice weekly with good management.   Primary insomnia - Plan: zolpidem  (AMBIEN ) 10 MG tablet as needed for sleep. Takes rarely.   Screening for breast cancer - Normal mammogram history.  Continue annual screenings. Scheduled tomorrow.  Normal breast exam today.  Screening for colon cancer -  08/2019 colonoscopy. Will repeat at 5-year interval per GI's recommendation.  Return in about 1 year (around 10/26/2024) for Annual.     Andee Bamberger DNP, 8:26 AM 10/27/2023

## 2023-10-28 ENCOUNTER — Ambulatory Visit
Admission: RE | Admit: 2023-10-28 | Discharge: 2023-10-28 | Disposition: A | Discharge status: Home / Self Care | Source: Ambulatory Visit | Attending: Family Medicine | Admitting: Family Medicine

## 2023-10-28 DIAGNOSIS — Z0000 Encounter for general adult medical examination without abnormal findings: Secondary | ICD-10-CM

## 2023-10-28 DIAGNOSIS — Z1231 Encounter for screening mammogram for malignant neoplasm of breast: Secondary | ICD-10-CM | POA: Diagnosis not present

## 2023-11-07 LAB — CYTOLOGY - PAP
Adequacy: ABSENT
Comment: NEGATIVE
Diagnosis: NEGATIVE
High risk HPV: NEGATIVE

## 2023-11-08 ENCOUNTER — Ambulatory Visit: Payer: Self-pay | Admitting: Nurse Practitioner

## 2023-11-14 ENCOUNTER — Telehealth: Payer: Self-pay | Admitting: *Deleted

## 2023-11-14 NOTE — Telephone Encounter (Signed)
 Call returned to patient.   Advised of results as seen below.  Pap recall placed.   Patient verbalizes understanding and is agreeable.   Andee Bamberger, NP to [employer]     11/08/23  8:59 AM Good morning, Your pap is normal. Based on this and your previous negative biopsies, recommendations are to repeat pap in 3 years. Have a great summer!   Tiffany  This MyChart message has not been read. Cytology - PAP( [facility] [place])

## 2023-11-29 ENCOUNTER — Other Ambulatory Visit: Payer: Self-pay | Admitting: Nurse Practitioner

## 2023-11-29 DIAGNOSIS — N952 Postmenopausal atrophic vaginitis: Secondary | ICD-10-CM

## 2023-11-30 NOTE — Telephone Encounter (Signed)
 Med refill request: estradiol  0.1 vaginal cream Last AEX: 10/27/23 Next AEX: none scheduled Last MMG (if hormonal med) 10/26/22 normal RX was written 10/27/22 for 42.5 grams with 3 refills.  Noted on RX, patient should have refills. Sent to provider for review.

## 2023-12-15 DIAGNOSIS — E039 Hypothyroidism, unspecified: Secondary | ICD-10-CM | POA: Diagnosis not present

## 2023-12-26 DIAGNOSIS — H40013 Open angle with borderline findings, low risk, bilateral: Secondary | ICD-10-CM | POA: Diagnosis not present

## 2023-12-26 DIAGNOSIS — H2513 Age-related nuclear cataract, bilateral: Secondary | ICD-10-CM | POA: Diagnosis not present

## 2023-12-28 DIAGNOSIS — E039 Hypothyroidism, unspecified: Secondary | ICD-10-CM | POA: Diagnosis not present

## 2023-12-28 DIAGNOSIS — E669 Obesity, unspecified: Secondary | ICD-10-CM | POA: Diagnosis not present

## 2023-12-28 DIAGNOSIS — M810 Age-related osteoporosis without current pathological fracture: Secondary | ICD-10-CM | POA: Diagnosis not present

## 2024-03-20 ENCOUNTER — Other Ambulatory Visit: Payer: Self-pay | Admitting: Nurse Practitioner

## 2024-03-20 ENCOUNTER — Telehealth: Payer: Self-pay | Admitting: *Deleted

## 2024-03-20 DIAGNOSIS — N952 Postmenopausal atrophic vaginitis: Secondary | ICD-10-CM

## 2024-03-20 MED ORDER — ESTRADIOL 10 MCG VA TABS: 1.0000 | ORAL_TABLET | VAGINAL | 1 refills | Status: AC

## 2024-03-20 NOTE — Telephone Encounter (Signed)
 Call returned to patient. Reports vaginal estradiol  tabs $157, she is awaiting return call from pharmacy to discuss, will return call if she decides to go back to cream.

## 2024-03-20 NOTE — Telephone Encounter (Signed)
 Patient left message on triage line at 1540 on 03/19/24.   Patient is due refill of vaginal estrogen cream, requesting to change to vaginal inserts instead. Pharmacy [facility].

## 2024-03-20 NOTE — Telephone Encounter (Signed)
 Left message advising requested RX sent.   Encounter closed.

## 2024-03-20 NOTE — Telephone Encounter (Signed)
 Vaginal tablets sent to pharmacy. Thanks.

## 2024-03-20 NOTE — Telephone Encounter (Signed)
 Spoke with patient. Reports cream applicator and tube is messy, causing her to waste cream, then unable to refill, too early. Would prefer tablets, requesting new Rx.   Last AEX 10/27/23 -TW Next AEX not scheduled, will call to schedule.  MMG: 10/28/23, Bi rads 1 neg  Routing to Tiffany to review

## 2024-04-02 ENCOUNTER — Encounter: Payer: Self-pay | Admitting: Family Medicine

## 2024-04-02 ENCOUNTER — Ambulatory Visit: Admitting: Family Medicine

## 2024-04-02 VITALS — BP 150/80 | HR 66 | Temp 97.7°F | Wt 146.7 lb

## 2024-04-02 DIAGNOSIS — L84 Corns and callosities: Secondary | ICD-10-CM | POA: Diagnosis not present

## 2024-04-02 NOTE — Progress Notes (Signed)
 Established Patient Office Visit  Subjective   Patient ID: Colleen Martinez, female    DOB: 01-27-1961  Age: 63 y.o. MRN: [id number]  Chief Complaint  Patient presents with   Plantar Warts    HPI   Colleen Martinez is seen with left lateral foot pain just proximal to the MTP joint on the volar surface.  Colleen Martinez has had this before and apparently seen dermatologist and treated previously for plantar wart with cryotherapy and this been trimmed down a couple times.  Colleen Martinez had been doing a lot of training with elliptical and running sometimes up to 5 to 10 miles per run.  Has pain especially with activity.  No recent change of shoewear.    Blood pressure was slightly up today but Colleen Martinez has been getting this checked regularly at work with manual cuff and has had consistently controlled readings.  Past Medical History:  Diagnosis Date   Abnormal Pap smear of cervix    pap 09-30-21 lgsil hpv hr +   Cervical dysplasia 2007   LGSIL   Chicken pox    Heart murmur    functional   Hx of colonic polyps 08/29/2019   Skin cancer, basal cell    Thyroid  disease    Hypothyroidism   Past Surgical History:  Procedure Laterality Date   AUGMENTATION MAMMAPLASTY     BREAST SURGERY  06/07/2002   breast augmentation   GYNECOLOGIC CRYOSURGERY  06/07/2005   LGSIL normal paps since   liposuction  06/07/2002   stomach , bi-lat thighs   MOHS SURGERY N/A     reports that Colleen Martinez has never smoked. Colleen Martinez has never been exposed to tobacco smoke. Colleen Martinez has never used smokeless tobacco. Colleen Martinez reports current alcohol use of about 2.0 standard drinks of alcohol per week. Colleen Martinez reports that Colleen Martinez does not use drugs. family history includes Arthritis in her father and mother; Diabetes in her sister; Hyperlipidemia in her father and mother; Ovarian cancer in her mother. No Known Allergies  Review of Systems  Constitutional:  Negative for chills and fever.      Objective:     BP (!) 150/80   Pulse 66   Temp 97.7 F (36.5 C)  (Oral)   Wt 146 lb 11.2 oz (66.5 kg)   LMP 07/20/2015   SpO2 99%   BMI 25.58 kg/m  BP Readings from Last 3 Encounters:  04/02/24 (!) 150/80  10/27/23 116/64  09/23/23 (!) 149/86   Wt Readings from Last 3 Encounters:  04/02/24 146 lb 11.2 oz (66.5 kg)  10/27/23 141 lb 9.6 oz (64.2 kg)  09/05/23 140 lb (63.5 kg)      Physical Exam Vitals reviewed.  Constitutional:      General: Colleen Martinez is not in acute distress. Cardiovascular:     Rate and Rhythm: Normal rate and regular rhythm.  Skin:    Comments: Left foot reveals callused area distal lateral volar surface.  This is over an area about one half by 1/2 cm.  We did not appreciate any verrucous type growth or any blackened specks typical of plantar wart.  Neurological:     Mental Status: Colleen Martinez is alert.      No results found for any visits on 04/02/24.    The 10-year ASCVD risk score (Arnett DK, et al., 2019) is: 4%    Assessment & Plan:   Painful callused area volar surface left foot.  This has been treated as plantar wart previously-but really looks more like callus tissue at this time.SABRA  We did discuss paring this down with #15 blade and patient consented.  We prepped skin with alcohol.  Gently pared down layers of callused tissue and patient tolerated well.  Colleen Martinez will see how things feel with running over the next few weeks.  We did explain this will likely recur.  We discussed potential podiatry referral for further intervention and Colleen Martinez will consider   Wolm Scarlet, MD

## 2024-05-29 ENCOUNTER — Encounter: Payer: Self-pay | Admitting: Family Medicine

## 2024-05-29 ENCOUNTER — Telehealth: Admitting: Family Medicine

## 2024-05-29 DIAGNOSIS — J069 Acute upper respiratory infection, unspecified: Secondary | ICD-10-CM | POA: Diagnosis not present

## 2024-05-29 MED ORDER — HYDROCOD POLI-CHLORPHE POLI ER 10-8 MG/5ML PO SUER: 5.0000 mL | Freq: Two times a day (BID) | ORAL | 0 refills | Status: AC | PRN

## 2024-05-29 NOTE — Progress Notes (Signed)
 Patient ID: Colleen Martinez, female   DOB: January 26, 1961, 63 y.o.   MRN: [id number]  Virtual Visit via Video Note  I connected with Colleen Martinez on 05/29/2024 at  4:30 PM EST by a video enabled telemedicine application and verified that I am speaking with the correct person using two identifiers.  Location patient: home Location provider:work or home office Persons participating in the virtual visit: patient, provider  I discussed the limitations of evaluation and management by telemedicine and the availability of in person appointments. The patient expressed understanding and agreed to proceed.   HPI:  Colleen Martinez called with upper respiratory symptoms with cough.  Onset Saturday.  Her husband had similar symptoms recently.  She woke up with severe sore throat Saturday.  She had some sinus congestion.  Severe cough at night.  Cannot take with NyQuil secondary to side effects.  Denies any fever.  No obvious wheezing.  No dyspnea.  Mild bodyaches and fatigue.  Keeping down fluids.  Has taken Tussionex in the past which has worked well for severe cough.  Requesting the same.   ROS: See pertinent positives and negatives per HPI.  Past Medical History:  Diagnosis Date   Abnormal Pap smear of cervix    pap 09-30-21 lgsil hpv hr +   Cervical dysplasia 2007   LGSIL   Chicken pox    Heart murmur    functional   Hx of colonic polyps 08/29/2019   Skin cancer, basal cell    Thyroid  disease    Hypothyroidism    Past Surgical History:  Procedure Laterality Date   AUGMENTATION MAMMAPLASTY     BREAST SURGERY  06/07/2002   breast augmentation   GYNECOLOGIC CRYOSURGERY  06/07/2005   LGSIL normal paps since   liposuction  06/07/2002   stomach , bi-lat thighs   MOHS SURGERY N/A     Family History  Problem Relation Age of Onset   Arthritis Mother    Hyperlipidemia Mother    Ovarian cancer Mother    Arthritis Father    Hyperlipidemia Father    Diabetes Sister     SOCIAL HX:  Non-smoker  Current Medications[1]  EXAM:  VITALS per patient if applicable:  GENERAL: alert, oriented, appears well and in no acute distress  HEENT: atraumatic, conjunttiva clear, no obvious abnormalities on inspection of external nose and ears  NECK: normal movements of the head and neck  LUNGS: on inspection no signs of respiratory distress, breathing rate appears normal, no obvious gross SOB, gasping or wheezing  CV: no obvious cyanosis  MS: moves all visible extremities without noticeable abnormality  PSYCH/NEURO: pleasant and cooperative, no obvious depression or anxiety, speech and thought processing grossly intact  ASSESSMENT AND PLAN:  Discussed the following assessment and plan:  Viral URI with cough.  Patient has had severe cough especially at night and cannot take NyQuil and not relieved with other over-the-counter medications.  We refilled Tussionex 1 teaspoon nightly as needed for severe cough dispense number 70 mL.  Remind her not to combine with Ambien .  Follow-up for any fever, increased shortness of breath, or other concerns.     I discussed the assessment and treatment plan with the patient. The patient was provided an opportunity to ask questions and all were answered. The patient agreed with the plan and demonstrated an understanding of the instructions.   The patient was advised to call back or seek an in-person evaluation if the symptoms worsen or if the condition fails to improve as anticipated.  Wolm Scarlet, MD      [1]  Current Outpatient Medications:    CALCIUM PO, Take by mouth., Disp: , Rfl:    chlorpheniramine-HYDROcodone  (TUSSIONEX) 10-8 MG/5ML, Take 5 mLs by mouth every 12 (twelve) hours as needed for cough., Disp: 70 mL, Rfl: 0   Cholecalciferol (VITAMIN D  PO), Take by mouth., Disp: , Rfl:    Estradiol  10 MCG TABS vaginal tablet, Place 1 tablet (10 mcg total) vaginally 2 (two) times a week., Disp: 24 tablet, Rfl: 1   levothyroxine  (SYNTHROID) 88 MCG tablet, Synthroid 88 mcg tablet, Disp: , Rfl:    Omega-3 Fatty Acids (FISH OIL PO), Take by mouth., Disp: , Rfl:    Prasterone, DHEA, (DHEA PO), Take by mouth., Disp: , Rfl:    zolpidem  (AMBIEN ) 10 MG tablet, Take 1 tablet (10 mg total) by mouth at bedtime as needed. for sleep, Disp: 30 tablet, Rfl: 1
